# Patient Record
Sex: Female | Born: 1980 | Race: Black or African American | Hispanic: No | Smoking: Never smoker
Health system: Southern US, Community
[De-identification: ages and names within clinical notes are randomized; demographics above are authoritative.]

## PROBLEM LIST (undated history)

## (undated) DIAGNOSIS — R609 Edema, unspecified: Secondary | ICD-10-CM

## (undated) DIAGNOSIS — R519 Headache, unspecified: Secondary | ICD-10-CM

## (undated) DIAGNOSIS — M94262 Chondromalacia, left knee: Secondary | ICD-10-CM

## (undated) DIAGNOSIS — D649 Anemia, unspecified: Secondary | ICD-10-CM

## (undated) DIAGNOSIS — J309 Allergic rhinitis, unspecified: Secondary | ICD-10-CM

## (undated) DIAGNOSIS — M1990 Unspecified osteoarthritis, unspecified site: Secondary | ICD-10-CM

## (undated) DIAGNOSIS — Z973 Presence of spectacles and contact lenses: Secondary | ICD-10-CM

## (undated) DIAGNOSIS — R011 Cardiac murmur, unspecified: Secondary | ICD-10-CM

## (undated) DIAGNOSIS — I10 Essential (primary) hypertension: Secondary | ICD-10-CM

## (undated) DIAGNOSIS — R51 Headache: Secondary | ICD-10-CM

## (undated) DIAGNOSIS — K219 Gastro-esophageal reflux disease without esophagitis: Secondary | ICD-10-CM

## (undated) DIAGNOSIS — S83512A Sprain of anterior cruciate ligament of left knee, initial encounter: Secondary | ICD-10-CM

## (undated) DIAGNOSIS — S83419A Sprain of medial collateral ligament of unspecified knee, initial encounter: Secondary | ICD-10-CM

## (undated) HISTORY — DX: Gastro-esophageal reflux disease without esophagitis: K21.9

## (undated) HISTORY — DX: Unspecified osteoarthritis, unspecified site: M19.90

## (undated) HISTORY — DX: Headache: R51

## (undated) HISTORY — DX: Cardiac murmur, unspecified: R01.1

## (undated) HISTORY — DX: Essential (primary) hypertension: I10

## (undated) HISTORY — DX: Allergic rhinitis, unspecified: J30.9

## (undated) HISTORY — DX: Anemia, unspecified: D64.9

## (undated) HISTORY — DX: Headache, unspecified: R51.9

---
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
## 2002-02-06 ENCOUNTER — Inpatient Hospital Stay (HOSPITAL_COMMUNITY): Admission: AD | Admit: 2002-02-06 | Discharge: 2002-02-06 | Payer: Self-pay | Admitting: *Deleted

## 2002-02-06 ENCOUNTER — Encounter: Payer: Self-pay | Admitting: Family Medicine

## 2002-02-28 ENCOUNTER — Inpatient Hospital Stay (HOSPITAL_COMMUNITY): Admission: AD | Admit: 2002-02-28 | Discharge: 2002-02-28 | Payer: Self-pay | Admitting: *Deleted

## 2002-03-17 ENCOUNTER — Ambulatory Visit (HOSPITAL_COMMUNITY): Admission: RE | Admit: 2002-03-17 | Discharge: 2002-03-17 | Payer: Self-pay | Admitting: *Deleted

## 2002-04-19 ENCOUNTER — Ambulatory Visit (HOSPITAL_COMMUNITY): Admission: RE | Admit: 2002-04-19 | Discharge: 2002-04-19 | Payer: Self-pay | Admitting: *Deleted

## 2002-05-28 ENCOUNTER — Inpatient Hospital Stay (HOSPITAL_COMMUNITY): Admission: AD | Admit: 2002-05-28 | Discharge: 2002-05-28 | Payer: Self-pay | Admitting: Obstetrics and Gynecology

## 2002-06-22 ENCOUNTER — Other Ambulatory Visit: Admission: RE | Admit: 2002-06-22 | Discharge: 2002-06-22 | Payer: Self-pay | Admitting: Obstetrics and Gynecology

## 2002-07-04 ENCOUNTER — Inpatient Hospital Stay (HOSPITAL_COMMUNITY): Admission: AD | Admit: 2002-07-04 | Discharge: 2002-07-04 | Payer: Self-pay | Admitting: Obstetrics & Gynecology

## 2002-07-18 ENCOUNTER — Inpatient Hospital Stay (HOSPITAL_COMMUNITY): Admission: AD | Admit: 2002-07-18 | Discharge: 2002-07-18 | Payer: Self-pay | Admitting: Obstetrics and Gynecology

## 2002-08-13 ENCOUNTER — Inpatient Hospital Stay (HOSPITAL_COMMUNITY): Admission: AD | Admit: 2002-08-13 | Discharge: 2002-08-13 | Payer: Self-pay | Admitting: Obstetrics and Gynecology

## 2002-08-18 ENCOUNTER — Inpatient Hospital Stay (HOSPITAL_COMMUNITY): Admission: AD | Admit: 2002-08-18 | Discharge: 2002-08-18 | Payer: Self-pay | Admitting: Obstetrics and Gynecology

## 2002-08-18 ENCOUNTER — Encounter: Payer: Self-pay | Admitting: Obstetrics and Gynecology

## 2002-09-11 ENCOUNTER — Inpatient Hospital Stay (HOSPITAL_COMMUNITY): Admission: AD | Admit: 2002-09-11 | Discharge: 2002-09-11 | Payer: Self-pay | Admitting: Obstetrics & Gynecology

## 2002-09-12 ENCOUNTER — Inpatient Hospital Stay (HOSPITAL_COMMUNITY): Admission: AD | Admit: 2002-09-12 | Discharge: 2002-09-14 | Payer: Self-pay | Admitting: Obstetrics & Gynecology

## 2002-09-16 ENCOUNTER — Encounter: Payer: Self-pay | Admitting: Emergency Medicine

## 2002-09-16 ENCOUNTER — Emergency Department (HOSPITAL_COMMUNITY): Admission: EM | Admit: 2002-09-16 | Discharge: 2002-09-16 | Payer: Self-pay | Admitting: Emergency Medicine

## 2002-09-29 ENCOUNTER — Encounter: Admission: RE | Admit: 2002-09-29 | Discharge: 2002-09-29 | Payer: Self-pay | Admitting: Family Medicine

## 2002-09-29 ENCOUNTER — Encounter: Payer: Self-pay | Admitting: Family Medicine

## 2003-02-16 ENCOUNTER — Emergency Department (HOSPITAL_COMMUNITY): Admission: AD | Admit: 2003-02-16 | Discharge: 2003-02-17 | Payer: Self-pay | Admitting: Family Medicine

## 2003-02-16 ENCOUNTER — Encounter: Payer: Self-pay | Admitting: Internal Medicine

## 2003-06-11 ENCOUNTER — Emergency Department (HOSPITAL_COMMUNITY): Admission: EM | Admit: 2003-06-11 | Discharge: 2003-06-11 | Payer: Self-pay | Admitting: Emergency Medicine

## 2003-10-28 ENCOUNTER — Emergency Department (HOSPITAL_COMMUNITY): Admission: EM | Admit: 2003-10-28 | Discharge: 2003-10-29 | Payer: Self-pay | Admitting: Emergency Medicine

## 2003-11-01 ENCOUNTER — Emergency Department (HOSPITAL_COMMUNITY): Admission: EM | Admit: 2003-11-01 | Discharge: 2003-11-01 | Payer: Self-pay | Admitting: Family Medicine

## 2003-12-01 ENCOUNTER — Encounter: Admission: RE | Admit: 2003-12-01 | Discharge: 2004-02-29 | Payer: Self-pay | Admitting: Chiropractic Medicine

## 2003-12-06 ENCOUNTER — Encounter: Admission: RE | Admit: 2003-12-06 | Discharge: 2004-03-05 | Payer: Self-pay | Admitting: Orthopedic Surgery

## 2004-02-07 ENCOUNTER — Ambulatory Visit: Payer: Self-pay | Admitting: *Deleted

## 2004-06-26 ENCOUNTER — Encounter: Admission: RE | Admit: 2004-06-26 | Discharge: 2004-06-26 | Payer: Self-pay | Admitting: Obstetrics and Gynecology

## 2004-08-09 ENCOUNTER — Emergency Department (HOSPITAL_COMMUNITY): Admission: EM | Admit: 2004-08-09 | Discharge: 2004-08-09 | Payer: Self-pay | Admitting: Emergency Medicine

## 2004-09-09 ENCOUNTER — Emergency Department (HOSPITAL_COMMUNITY): Admission: EM | Admit: 2004-09-09 | Discharge: 2004-09-09 | Payer: Self-pay | Admitting: Emergency Medicine

## 2004-11-22 ENCOUNTER — Emergency Department (HOSPITAL_COMMUNITY): Admission: EM | Admit: 2004-11-22 | Discharge: 2004-11-22 | Payer: Self-pay | Admitting: Emergency Medicine

## 2004-11-22 ENCOUNTER — Emergency Department (HOSPITAL_COMMUNITY): Admission: EM | Admit: 2004-11-22 | Discharge: 2004-11-22 | Payer: Self-pay | Admitting: Family Medicine

## 2005-05-30 ENCOUNTER — Emergency Department (HOSPITAL_COMMUNITY): Admission: EM | Admit: 2005-05-30 | Discharge: 2005-05-30 | Payer: Self-pay | Admitting: Family Medicine

## 2005-08-03 IMAGING — CR DG CHEST 2V
2 series · 2 of 2 positions shown · non-contrast
Comparison: none

CLINICAL DATA: Chest pain.  Back pain.  Shortness of breath. 
 CHEST ? 2 VIEW:
 PA and lateral views of the chest dated 09/09/04 are compared with a prior study of 08/09/04.  The heart remains normal in size.  The pulmonary vascularity and the lung fields are unremarkable.

[view not recorded (1 of 2)]
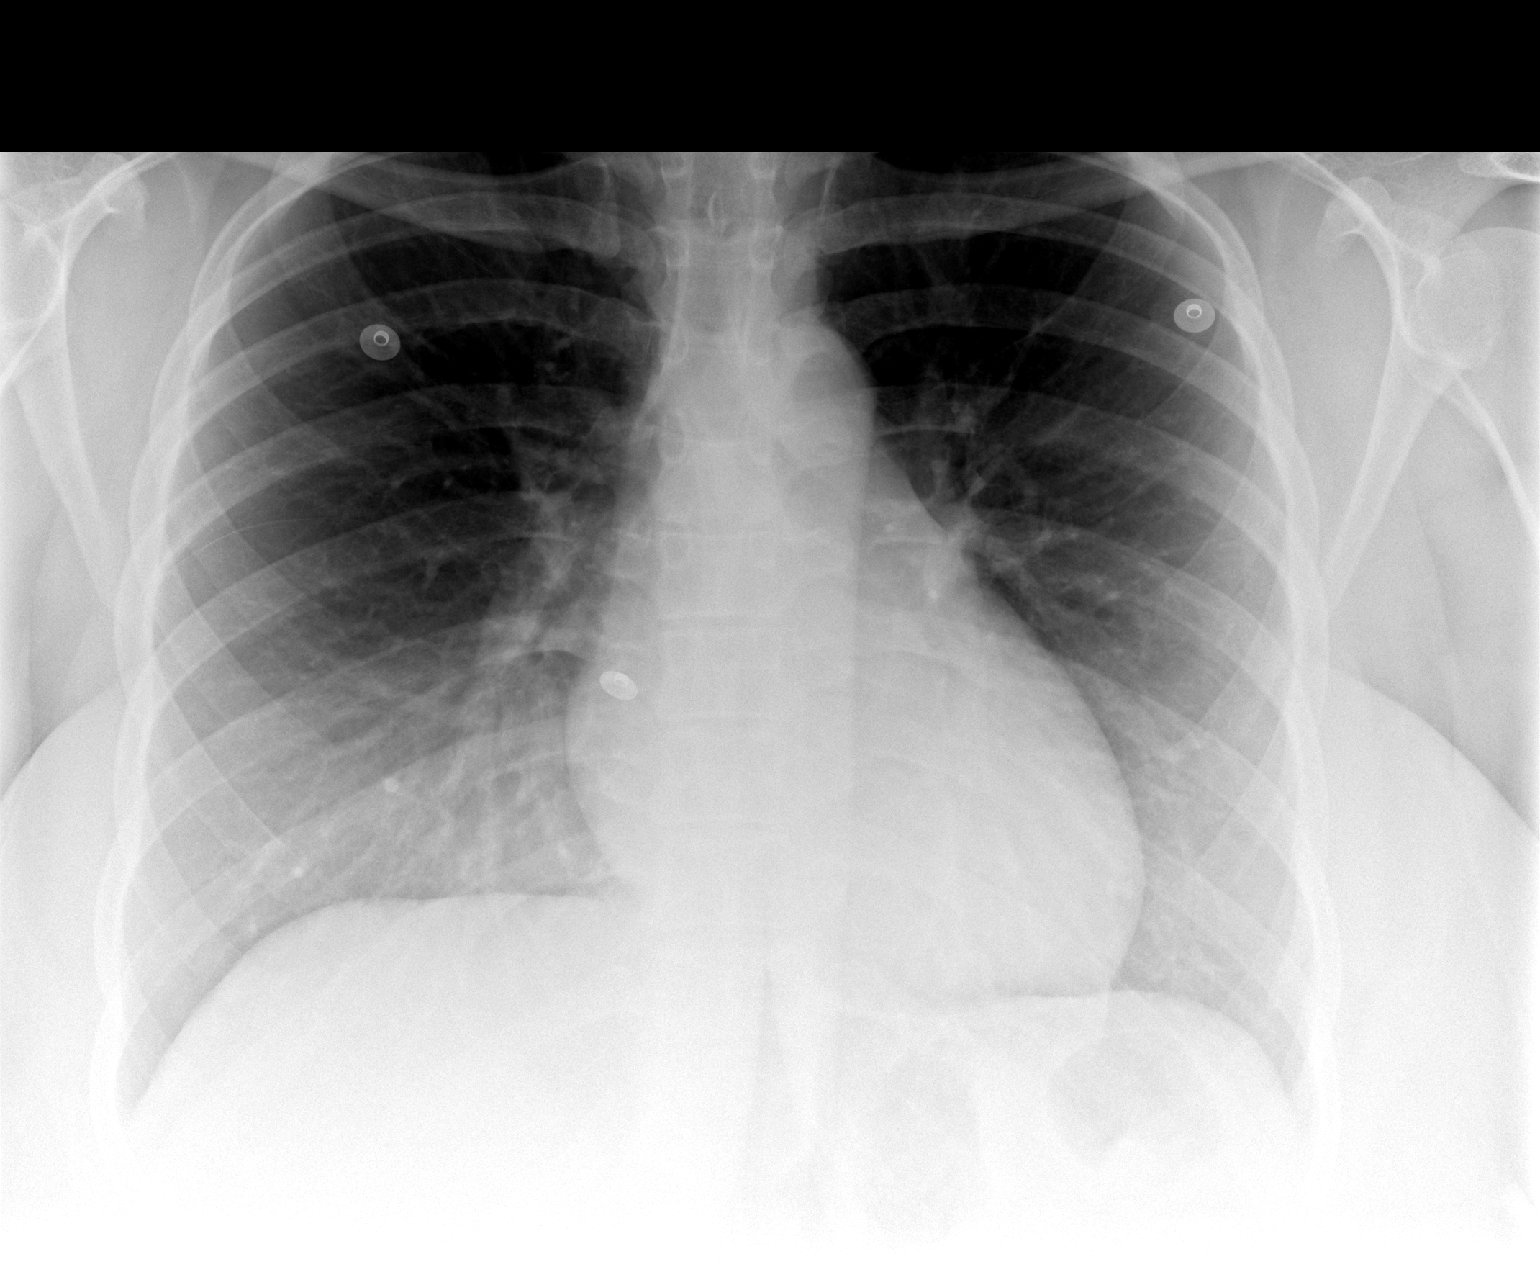

[view not recorded (2 of 2)]
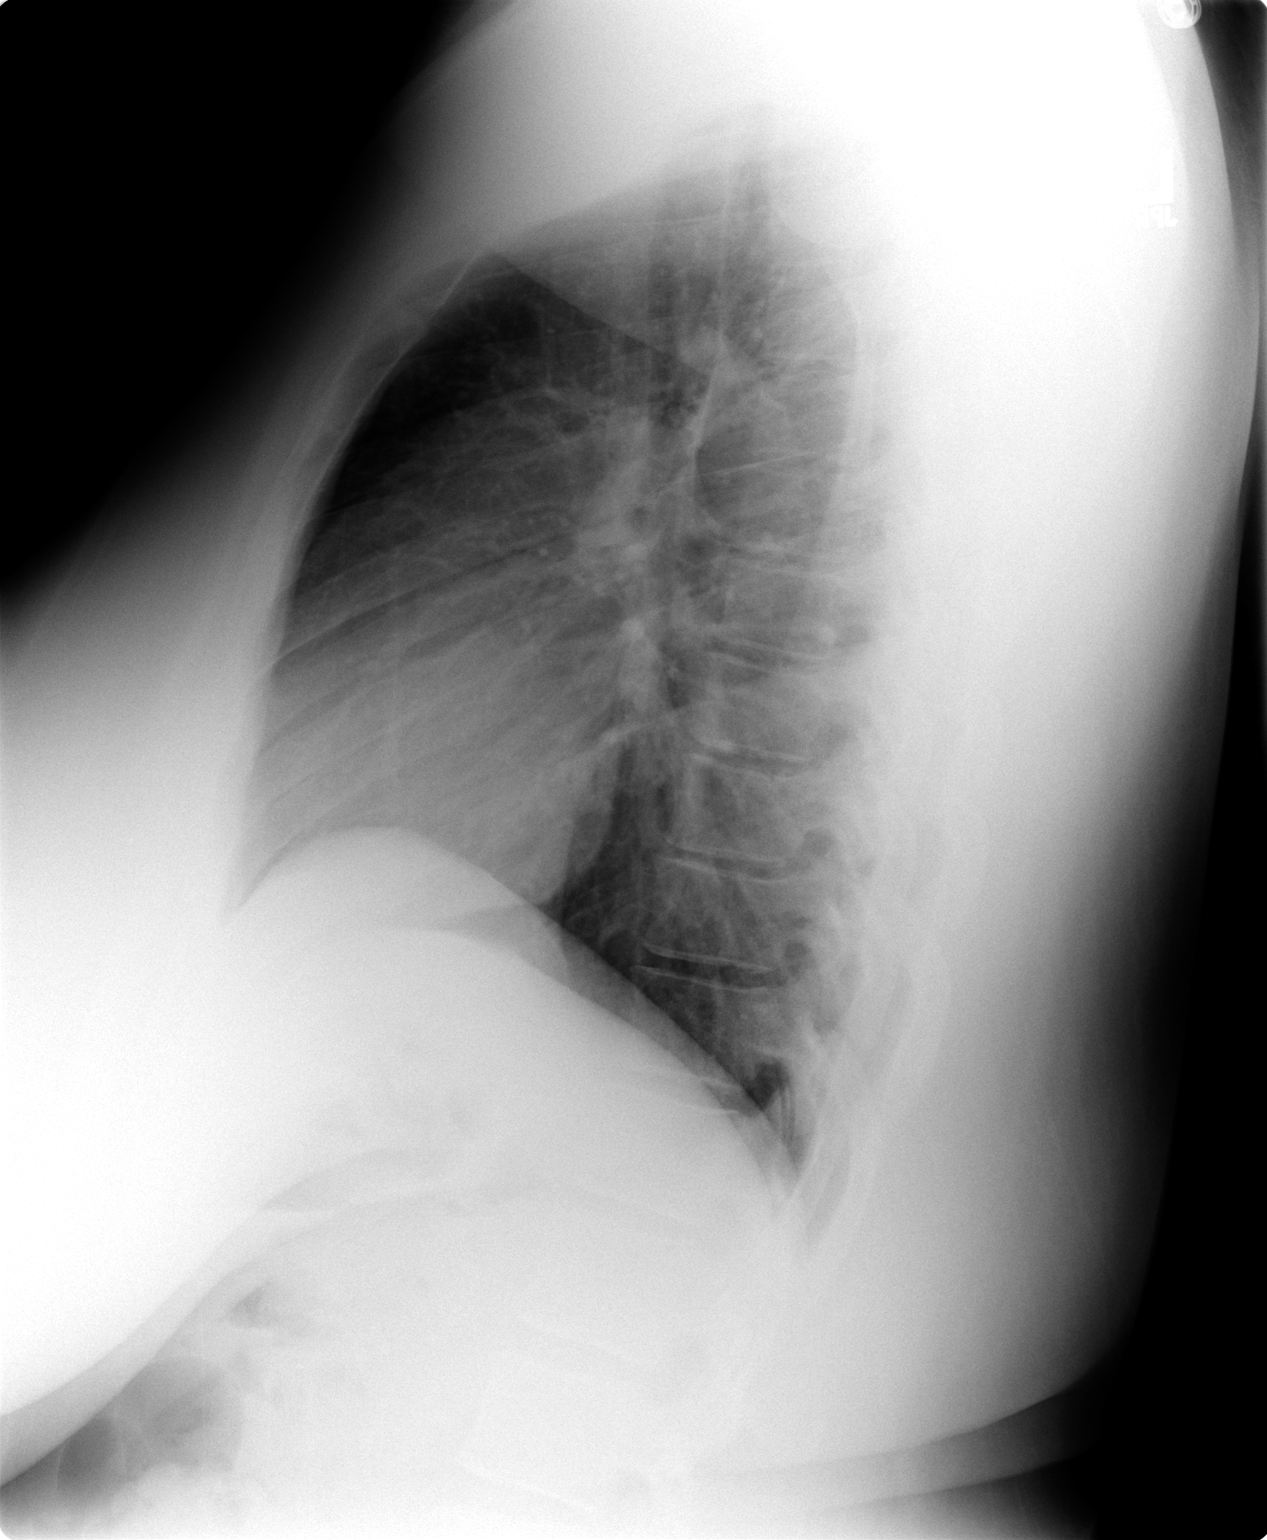

[2 of 2 positions shown; findings below may reference images not displayed]

IMPRESSION: Negative chest for active disease or interval change.

## 2006-03-24 ENCOUNTER — Emergency Department (HOSPITAL_COMMUNITY): Admission: EM | Admit: 2006-03-24 | Discharge: 2006-03-24 | Payer: Self-pay | Admitting: Family Medicine

## 2006-11-24 ENCOUNTER — Emergency Department (HOSPITAL_COMMUNITY): Admission: EM | Admit: 2006-11-24 | Discharge: 2006-11-24 | Payer: Self-pay | Admitting: Emergency Medicine

## 2006-11-26 ENCOUNTER — Emergency Department (HOSPITAL_COMMUNITY): Admission: EM | Admit: 2006-11-26 | Discharge: 2006-11-26 | Payer: Self-pay | Admitting: Emergency Medicine

## 2007-01-07 ENCOUNTER — Ambulatory Visit: Payer: Self-pay | Admitting: Obstetrics and Gynecology

## 2007-03-17 ENCOUNTER — Emergency Department (HOSPITAL_COMMUNITY): Admission: EM | Admit: 2007-03-17 | Discharge: 2007-03-17 | Payer: Self-pay | Admitting: Emergency Medicine

## 2007-03-25 ENCOUNTER — Emergency Department (HOSPITAL_COMMUNITY): Admission: EM | Admit: 2007-03-25 | Discharge: 2007-03-25 | Payer: Self-pay | Admitting: Emergency Medicine

## 2007-03-27 ENCOUNTER — Emergency Department (HOSPITAL_COMMUNITY): Admission: EM | Admit: 2007-03-27 | Discharge: 2007-03-27 | Payer: Self-pay | Admitting: Emergency Medicine

## 2007-05-12 ENCOUNTER — Emergency Department (HOSPITAL_COMMUNITY): Admission: EM | Admit: 2007-05-12 | Discharge: 2007-05-12 | Payer: Self-pay | Admitting: Emergency Medicine

## 2007-06-08 ENCOUNTER — Emergency Department (HOSPITAL_COMMUNITY): Admission: EM | Admit: 2007-06-08 | Discharge: 2007-06-08 | Payer: Self-pay | Admitting: Emergency Medicine

## 2007-06-09 ENCOUNTER — Emergency Department (HOSPITAL_COMMUNITY): Admission: EM | Admit: 2007-06-09 | Discharge: 2007-06-09 | Payer: Self-pay | Admitting: Family Medicine

## 2008-01-20 ENCOUNTER — Ambulatory Visit: Payer: Self-pay | Admitting: Family Medicine

## 2008-01-25 ENCOUNTER — Emergency Department (HOSPITAL_COMMUNITY): Admission: EM | Admit: 2008-01-25 | Discharge: 2008-01-25 | Payer: Self-pay | Admitting: Family Medicine

## 2008-02-05 ENCOUNTER — Emergency Department (HOSPITAL_COMMUNITY): Admission: EM | Admit: 2008-02-05 | Discharge: 2008-02-05 | Payer: Self-pay | Admitting: Emergency Medicine

## 2008-02-10 ENCOUNTER — Encounter: Payer: Self-pay | Admitting: Physician Assistant

## 2008-02-10 ENCOUNTER — Ambulatory Visit: Payer: Self-pay | Admitting: Obstetrics & Gynecology

## 2008-02-10 LAB — CONVERTED CEMR LAB

## 2008-02-11 ENCOUNTER — Emergency Department (HOSPITAL_COMMUNITY): Admission: EM | Admit: 2008-02-11 | Discharge: 2008-02-11 | Payer: Self-pay | Admitting: Emergency Medicine

## 2008-02-11 ENCOUNTER — Encounter: Payer: Self-pay | Admitting: Physician Assistant

## 2008-02-11 LAB — CONVERTED CEMR LAB: GC Probe Amp, Genital: NEGATIVE

## 2008-02-22 ENCOUNTER — Ambulatory Visit: Payer: Self-pay | Admitting: Family Medicine

## 2008-03-06 ENCOUNTER — Emergency Department (HOSPITAL_COMMUNITY): Admission: EM | Admit: 2008-03-06 | Discharge: 2008-03-06 | Payer: Self-pay | Admitting: Emergency Medicine

## 2008-05-05 ENCOUNTER — Inpatient Hospital Stay (HOSPITAL_COMMUNITY): Admission: AD | Admit: 2008-05-05 | Discharge: 2008-05-06 | Payer: Self-pay | Admitting: Obstetrics and Gynecology

## 2008-05-12 ENCOUNTER — Emergency Department (HOSPITAL_COMMUNITY): Admission: EM | Admit: 2008-05-12 | Discharge: 2008-05-12 | Payer: Self-pay | Admitting: Emergency Medicine

## 2008-05-17 ENCOUNTER — Emergency Department (HOSPITAL_COMMUNITY): Admission: EM | Admit: 2008-05-17 | Discharge: 2008-05-17 | Payer: Self-pay | Admitting: Emergency Medicine

## 2008-09-06 ENCOUNTER — Inpatient Hospital Stay (HOSPITAL_COMMUNITY): Admission: AD | Admit: 2008-09-06 | Discharge: 2008-09-07 | Payer: Self-pay | Admitting: Family Medicine

## 2008-09-21 ENCOUNTER — Emergency Department (HOSPITAL_COMMUNITY): Admission: EM | Admit: 2008-09-21 | Discharge: 2008-09-21 | Payer: Self-pay | Admitting: Emergency Medicine

## 2009-03-01 ENCOUNTER — Emergency Department (HOSPITAL_COMMUNITY): Admission: EM | Admit: 2009-03-01 | Discharge: 2009-03-01 | Payer: Self-pay | Admitting: Family Medicine

## 2009-03-10 ENCOUNTER — Emergency Department (HOSPITAL_COMMUNITY): Admission: EM | Admit: 2009-03-10 | Discharge: 2009-03-10 | Payer: Self-pay | Admitting: Emergency Medicine

## 2009-03-10 ENCOUNTER — Emergency Department (HOSPITAL_COMMUNITY): Admission: EM | Admit: 2009-03-10 | Discharge: 2009-03-10 | Payer: Self-pay | Admitting: Family Medicine

## 2009-04-08 ENCOUNTER — Inpatient Hospital Stay (HOSPITAL_COMMUNITY): Admission: AD | Admit: 2009-04-08 | Discharge: 2009-04-08 | Payer: Self-pay | Admitting: Obstetrics & Gynecology

## 2009-04-23 ENCOUNTER — Emergency Department (HOSPITAL_COMMUNITY): Admission: EM | Admit: 2009-04-23 | Discharge: 2009-04-23 | Payer: Self-pay | Admitting: Emergency Medicine

## 2009-06-06 ENCOUNTER — Inpatient Hospital Stay (HOSPITAL_COMMUNITY): Admission: AD | Admit: 2009-06-06 | Discharge: 2009-06-06 | Payer: Self-pay | Admitting: Obstetrics & Gynecology

## 2009-07-08 ENCOUNTER — Inpatient Hospital Stay (HOSPITAL_COMMUNITY): Admission: AD | Admit: 2009-07-08 | Discharge: 2009-07-08 | Payer: Self-pay | Admitting: Obstetrics and Gynecology

## 2009-07-15 ENCOUNTER — Inpatient Hospital Stay (HOSPITAL_COMMUNITY): Admission: AD | Admit: 2009-07-15 | Discharge: 2009-07-15 | Payer: Self-pay | Admitting: Obstetrics and Gynecology

## 2009-07-15 ENCOUNTER — Ambulatory Visit: Payer: Self-pay | Admitting: Obstetrics and Gynecology

## 2009-10-25 ENCOUNTER — Ambulatory Visit: Payer: Self-pay | Admitting: Nurse Practitioner

## 2009-10-25 ENCOUNTER — Inpatient Hospital Stay (HOSPITAL_COMMUNITY): Admission: AD | Admit: 2009-10-25 | Discharge: 2009-10-25 | Payer: Self-pay | Admitting: Obstetrics and Gynecology

## 2009-12-13 ENCOUNTER — Inpatient Hospital Stay (HOSPITAL_COMMUNITY): Admission: AD | Admit: 2009-12-13 | Discharge: 2009-12-13 | Payer: Self-pay | Admitting: Obstetrics and Gynecology

## 2009-12-13 ENCOUNTER — Ambulatory Visit: Payer: Self-pay | Admitting: Family Medicine

## 2010-01-25 ENCOUNTER — Inpatient Hospital Stay (HOSPITAL_COMMUNITY): Admission: RE | Admit: 2010-01-25 | Discharge: 2010-01-28 | Payer: Self-pay | Admitting: Obstetrics and Gynecology

## 2010-01-25 ENCOUNTER — Encounter (INDEPENDENT_AMBULATORY_CARE_PROVIDER_SITE_OTHER): Payer: Self-pay | Admitting: Obstetrics and Gynecology

## 2010-02-16 ENCOUNTER — Encounter
Admission: RE | Admit: 2010-02-16 | Discharge: 2010-03-18 | Payer: Self-pay | Source: Home / Self Care | Admitting: Obstetrics and Gynecology

## 2010-03-19 ENCOUNTER — Encounter
Admission: RE | Admit: 2010-03-19 | Discharge: 2010-04-18 | Payer: Self-pay | Source: Home / Self Care | Attending: Obstetrics and Gynecology | Admitting: Obstetrics and Gynecology

## 2010-04-19 ENCOUNTER — Encounter
Admission: RE | Admit: 2010-04-19 | Discharge: 2010-05-19 | Payer: Self-pay | Source: Home / Self Care | Attending: Obstetrics and Gynecology | Admitting: Obstetrics and Gynecology

## 2010-05-24 ENCOUNTER — Emergency Department (HOSPITAL_COMMUNITY)
Admission: EM | Admit: 2010-05-24 | Discharge: 2010-05-24 | Payer: Self-pay | Source: Home / Self Care | Admitting: Emergency Medicine

## 2010-07-11 LAB — CBC
HCT: 36.1 % (ref 36.0–46.0)
Hemoglobin: 10.8 g/dL — ABNORMAL LOW (ref 12.0–15.0)
MCHC: 33.8 g/dL (ref 30.0–36.0)
Platelets: 267 10*3/uL (ref 150–400)
RBC: 3.63 MIL/uL — ABNORMAL LOW (ref 3.87–5.11)
RDW: 13.5 % (ref 11.5–15.5)
WBC: 14.2 10*3/uL — ABNORMAL HIGH (ref 4.0–10.5)
WBC: 10.9 10*3/uL — ABNORMAL HIGH (ref 4.0–10.5)

## 2010-07-11 LAB — SURGICAL PCR SCREEN
MRSA, PCR: NEGATIVE
Staphylococcus aureus: NEGATIVE

## 2010-07-11 LAB — BASIC METABOLIC PANEL
BUN: 5 mg/dL — ABNORMAL LOW (ref 6–23)
GFR calc non Af Amer: >60 mL/min (ref >60)
Potassium: 3.7 mEq/L (ref 3.5–5.1)

## 2010-07-12 LAB — URINALYSIS, ROUTINE W REFLEX MICROSCOPIC
Glucose, UA: NEGATIVE mg/dL
Hgb urine dipstick: NEGATIVE
Leukocytes, UA: NEGATIVE
Protein, ur: 30 mg/dL — AB
Specific Gravity, Urine: >1.03 — ABNORMAL HIGH (ref 1.005–1.030)
Urobilinogen, UA: 1 mg/dL (ref 0.0–1.0)

## 2010-07-12 LAB — WET PREP, GENITAL: Yeast Wet Prep HPF POC: NONE SEEN

## 2010-07-12 LAB — URINE MICROSCOPIC-ADD ON

## 2010-07-13 ENCOUNTER — Emergency Department (HOSPITAL_COMMUNITY): Payer: Self-pay

## 2010-07-13 ENCOUNTER — Emergency Department (HOSPITAL_COMMUNITY)
Admission: EM | Admit: 2010-07-13 | Discharge: 2010-07-13 | Disposition: A | Discharge status: Home / Self Care | Payer: Self-pay | Attending: Emergency Medicine | Admitting: Emergency Medicine

## 2010-07-13 DIAGNOSIS — M542 Cervicalgia: Secondary | ICD-10-CM | POA: Insufficient documentation

## 2010-07-13 DIAGNOSIS — H5710 Ocular pain, unspecified eye: Secondary | ICD-10-CM | POA: Insufficient documentation

## 2010-07-13 DIAGNOSIS — R51 Headache: Secondary | ICD-10-CM | POA: Insufficient documentation

## 2010-07-15 LAB — URINALYSIS, ROUTINE W REFLEX MICROSCOPIC
Glucose, UA: NEGATIVE mg/dL
Ketones, ur: 15 mg/dL — AB
Nitrite: NEGATIVE
Specific Gravity, Urine: >1.03 — ABNORMAL HIGH (ref 1.005–1.030)
pH: 6 (ref 5.0–8.0)

## 2010-07-15 LAB — CBC
HCT: 34.8 % — ABNORMAL LOW (ref 36.0–46.0)
Hemoglobin: 11.9 g/dL — ABNORMAL LOW (ref 12.0–15.0)
MCV: 87.4 fL (ref 78.0–100.0)
Platelets: 261 10*3/uL (ref 150–400)

## 2010-07-15 LAB — WET PREP, GENITAL: Trich, Wet Prep: NONE SEEN

## 2010-07-15 LAB — DIFFERENTIAL
Lymphocytes Relative: 22 % (ref 12–46)
Lymphs Abs: 3 10*3/uL (ref 0.7–4.0)
Neutrophils Relative %: 75 % (ref 43–77)

## 2010-07-19 LAB — URINALYSIS, ROUTINE W REFLEX MICROSCOPIC
Ketones, ur: NEGATIVE mg/dL
Nitrite: NEGATIVE
Specific Gravity, Urine: >1.03 — ABNORMAL HIGH (ref 1.005–1.030)
Urobilinogen, UA: 0.2 mg/dL (ref 0.0–1.0)
pH: 5.5 (ref 5.0–8.0)

## 2010-07-19 LAB — WET PREP, GENITAL: Trich, Wet Prep: NONE SEEN

## 2010-07-19 LAB — HCG, QUANTITATIVE, PREGNANCY: hCG, Beta Chain, Quant, S: 27300 m[IU]/mL — ABNORMAL HIGH (ref <5)

## 2010-07-19 LAB — CBC
Hemoglobin: 12.2 g/dL (ref 12.0–15.0)
RBC: 4.25 MIL/uL (ref 3.87–5.11)
RDW: 12.9 % (ref 11.5–15.5)

## 2010-07-19 LAB — POCT PREGNANCY, URINE: Preg Test, Ur: POSITIVE

## 2010-07-19 LAB — ABO/RH: ABO/RH(D): A POS

## 2010-07-19 LAB — GC/CHLAMYDIA PROBE AMP, GENITAL: GC Probe Amp, Genital: NEGATIVE

## 2010-07-22 LAB — URINALYSIS, ROUTINE W REFLEX MICROSCOPIC
Bilirubin Urine: NEGATIVE
Glucose, UA: NEGATIVE mg/dL
Glucose, UA: NEGATIVE mg/dL
Hgb urine dipstick: NEGATIVE
Ketones, ur: 15 mg/dL — AB
Protein, ur: 30 mg/dL — AB
Protein, ur: NEGATIVE mg/dL
Urobilinogen, UA: 0.2 mg/dL (ref 0.0–1.0)
pH: 7.5 (ref 5.0–8.0)

## 2010-07-22 LAB — WET PREP, GENITAL
Trich, Wet Prep: NONE SEEN
Yeast Wet Prep HPF POC: NONE SEEN

## 2010-07-22 LAB — URINE MICROSCOPIC-ADD ON

## 2010-07-22 LAB — GC/CHLAMYDIA PROBE AMP, GENITAL: GC Probe Amp, Genital: NEGATIVE

## 2010-07-29 LAB — POCT URINALYSIS DIP (DEVICE)
Bilirubin Urine: NEGATIVE
Glucose, UA: NEGATIVE mg/dL
Hgb urine dipstick: NEGATIVE
Specific Gravity, Urine: >=1.03 (ref 1.005–1.030)
Urobilinogen, UA: 0.2 mg/dL (ref 0.0–1.0)

## 2010-07-29 LAB — POCT PREGNANCY, URINE: Preg Test, Ur: NEGATIVE

## 2010-07-29 LAB — GC/CHLAMYDIA PROBE AMP, GENITAL
Chlamydia, DNA Probe: NEGATIVE
GC Probe Amp, Genital: NEGATIVE

## 2010-07-29 LAB — WET PREP, GENITAL: Yeast Wet Prep HPF POC: NONE SEEN

## 2010-07-30 LAB — POCT PREGNANCY, URINE: Preg Test, Ur: NEGATIVE

## 2010-07-31 LAB — DIFFERENTIAL
Basophils Absolute: 0 10*3/uL (ref 0.0–0.1)
Eosinophils Absolute: 0.1 10*3/uL (ref 0.0–0.7)
Eosinophils Absolute: 0.2 10*3/uL (ref 0.0–0.7)
Eosinophils Relative: 2 % (ref 0–5)
Eosinophils Relative: 2 % (ref 0–5)
Lymphs Abs: 2.2 10*3/uL (ref 0.7–4.0)
Lymphs Abs: 3.4 10*3/uL (ref 0.7–4.0)
Monocytes Absolute: 0.5 10*3/uL (ref 0.1–1.0)
Monocytes Relative: 10 % (ref 3–12)

## 2010-07-31 LAB — CBC
HCT: 37.2 % (ref 36.0–46.0)
HCT: 37.8 % (ref 36.0–46.0)
MCV: 86.9 fL (ref 78.0–100.0)
MCV: 87.5 fL (ref 78.0–100.0)
Platelets: 274 10*3/uL (ref 150–400)
RBC: 4.32 MIL/uL (ref 3.87–5.11)
RDW: 12.5 % (ref 11.5–15.5)
WBC: 5.3 10*3/uL (ref 4.0–10.5)

## 2010-07-31 LAB — POCT URINALYSIS DIP (DEVICE)
Glucose, UA: NEGATIVE mg/dL
Specific Gravity, Urine: >=1.03 (ref 1.005–1.030)

## 2010-08-06 LAB — DIFFERENTIAL
Basophils Absolute: 0.1 10*3/uL (ref 0.0–0.1)
Basophils Relative: 1 % (ref 0–1)
Eosinophils Absolute: 0.1 10*3/uL (ref 0.0–0.7)
Eosinophils Relative: 1 % (ref 0–5)
Lymphocytes Relative: 42 % (ref 12–46)

## 2010-08-06 LAB — URINALYSIS, ROUTINE W REFLEX MICROSCOPIC
Glucose, UA: NEGATIVE mg/dL
Ketones, ur: NEGATIVE mg/dL
Protein, ur: NEGATIVE mg/dL

## 2010-08-06 LAB — WET PREP, GENITAL: Yeast Wet Prep HPF POC: NONE SEEN

## 2010-08-06 LAB — CBC
HCT: 35.7 % — ABNORMAL LOW (ref 36.0–46.0)
MCHC: 34.1 g/dL (ref 30.0–36.0)
MCV: 87.2 fL (ref 78.0–100.0)
Platelets: 263 10*3/uL (ref 150–400)
RDW: 12.9 % (ref 11.5–15.5)

## 2010-08-12 LAB — POCT I-STAT, CHEM 8
BUN: 10 mg/dL (ref 6–23)
Chloride: 106 mEq/L (ref 96–112)
Creatinine, Ser: 0.8 mg/dL (ref 0.4–1.2)
Glucose, Bld: 99 mg/dL (ref 70–99)
Potassium: 3.7 mEq/L (ref 3.5–5.1)

## 2010-08-12 LAB — URINE MICROSCOPIC-ADD ON

## 2010-08-12 LAB — URINALYSIS, ROUTINE W REFLEX MICROSCOPIC
Glucose, UA: NEGATIVE mg/dL
Leukocytes, UA: NEGATIVE
Protein, ur: 30 mg/dL — AB
pH: 5.5 (ref 5.0–8.0)

## 2010-08-12 LAB — CBC
HCT: 38.6 % (ref 36.0–46.0)
Hemoglobin: 12.6 g/dL (ref 12.0–15.0)
MCHC: 32.7 g/dL (ref 30.0–36.0)
RBC: 4.49 MIL/uL (ref 3.87–5.11)
RDW: 13.2 % (ref 11.5–15.5)

## 2010-08-12 LAB — GC/CHLAMYDIA PROBE AMP, GENITAL
Chlamydia, DNA Probe: NEGATIVE
GC Probe Amp, Genital: NEGATIVE

## 2010-08-12 LAB — COMPREHENSIVE METABOLIC PANEL
Alkaline Phosphatase: 83 U/L (ref 39–117)
BUN: 12 mg/dL (ref 6–23)
CO2: 27 mEq/L (ref 19–32)
Calcium: 9.2 mg/dL (ref 8.4–10.5)
GFR calc non Af Amer: >60 mL/min (ref >60)
Glucose, Bld: 94 mg/dL (ref 70–99)
Total Protein: 6.7 g/dL (ref 6.0–8.3)

## 2010-08-12 LAB — WET PREP, GENITAL
Clue Cells Wet Prep HPF POC: NONE SEEN
Trich, Wet Prep: NONE SEEN

## 2010-09-10 NOTE — Group Therapy Note (Signed)
NAMESHARNAE, WINFREE NO.:  [id number]   MEDICAL RECORD NO.:  [id number]          PATIENT TYPE:  WOC   LOCATION:  [facility]                   FACILITY:  [facility]   PHYSICIAN:  Argentina Donovan, MD        DATE OF BIRTH:  04-21-1981   DATE OF SERVICE:                                  CLINIC NOTE   SUBJECTIVE:  The patient is a 30 year old African-American female  gravida 1, para 1-0-0-1 who was referred by [facility]  because during a routine gynecological examination for family planning  they noticed a cervical polyp and sent her in for polyp removal.   PHYSICAL EXAMINATION:  Examining the patient, the external genitalia is  normal.  BUS within normal limits.  Vagina clean and well rugated.  The  cervix is clean, parous with a 1-1/2-cm polyp noted at the external os  of the cervix which was removed quite easily with a ring forceps, and  the base of coagulated with silver nitrate.  The patient is using  Implanon for contraception and is allergic to penicillin.   DIAGNOSIS:  Cervical polyp removed.           ______________________________  Argentina Donovan, MD     PR/MEDQ  D:  01/07/2007  T:  01/08/2007  Job:  [id number]

## 2010-09-10 NOTE — Group Therapy Note (Signed)
NAMESHEMICA, MEATH NO.:  [id number]   MEDICAL RECORD NO.:  [id number]          PATIENT TYPE:  WOC   LOCATION:  [facility]                   FACILITY:  [facility]   PHYSICIAN:  Maylon Cos, CNM    DATE OF BIRTH:  1980/07/06   DATE OF SERVICE:  02/10/2008                                  CLINIC NOTE   HISTORY OF PRESENT ILLNESS:  She presents today for a physical  examination and removal of her implanon.  The patient a 30 year old  African American female who is morbidly obese.  She had her implanon  placed in May 2008, and had complaints of her left arm going numb, and  feels like it is related to her implanon, and wants it removed related  to this.  She has been very happy with her birth control.  She has not  had menstrual cycles since it was placed.  She has had implanon chosen  due to unsuccessful use and not being a candidate for multiple other  contraceptive methods.  Other complaints today:  She is currently being  treated for a longstanding pharyngitis.  She states that she has been  treated now for approximately one month for pharyngitis where she has  been running a low grade fever of approximately 99.  They changed  antibiotics two times.  She was started on azithromycin and is now  currently on doxycycline.  Rapid strep and strep cultures have both come  back negative.  This treatment has happened through both [facility] and  [facility].  At today's visit, she does desire Pap smear and SCI  screening as well.   ALLERGIES:  IN HER MEDICAL HISTORY, SHE IS ALLERGIC TO PENICILLIN.   CURRENT MEDICATION:  1. Over-the-counter allergy medications.  2. A prescription nasal spray, however, she is aware of the name of      this.  3. Doxycycline, which she will end taking on Saturday.   IMMUNIZATIONS:  Currently up-to-date.   MENSTRUAL HISTORY:  She is not currently having a menstrual cycle since  May 2008 when her implanon was placed.   OBSTETRICAL  HISTORY:  She has been pregnant one time.  She has one  living child that was born 5 years ago via vaginal delivery.  There were  no complications.   PAST GYNECOLOGICAL HISTORY:  Her last Pap smear was done at in May 2008  which was normal.  She does have a history of abnormal Pap smears.  However, has never had any colpo cryo relief.   PAST SURGICAL HISTORY:  She has no known surgical history.   FAMILY HISTORY:  Positive for diabetes, heart disease in her  grandmother.  Heart attack in her grandmother.  High blood pressure in  both her parents, and a history of blood clots in her mother.   PERSONAL MEDICAL HISTORY:  Positive only for high blood pressure, is  currently being controlled on no medications.  She is currently working  on diet and exercise to control these.   SOCIAL HISTORY:  She is divorced, lives with her daughter.  She works as  a Advertising account planner the elderly,  and is in school at [employer] for  nursing.  She is a nonsmoker.  She does not drink any alcohol.  She  occasionally drinks caffeinated beverages, and she denies any IV drug or  addicting drug use as well as physical, mental or sexual abuse.   SYSTEMIC REVIEW:  Negative other than what was reviewed in the HPI.   PHYSICAL EXAMINATION:  GENERAL:  A very pleasant 30 year old [place]  American female who appears her stated age.  She is morbidly obese.  VITAL SIGNS:  Stable, temperature is 99.1, pulse is 77.  Her blood  pressure is 139/86, weight today is 307.6 and her height is 6 feet 1  inch.  HEENT:  She has good dentition.  Her pharynx is erythematous with no  exudate, +2 tonsils and pustular lesions.  She has no lymphadenopathy.  HEART:  Regular rate and rhythm without murmur.  No bruits.  LUNGS:  Clear to auscultation bilateral A&P.  BREASTS:  Large and pendulous.  They are symmetrical without masses or  lesions.  There is no dimpling or retracting of the skin.  Nipples are  erect without discharge.   ABDOMEN:  Obese with irregular stria.  It is nontender.  There are no  masses.  Femoral pulses are equal and bounding bilaterally.  GENITALIA:  She is a Tanner V.  Mucous membranes pink with a scant amount of white  discharge.  She has good tone and irregular rugae.  Cervix is easily  visualized and appears to pink without lesions.  On bimanual exam, there  is no cervical motion tenderness.  The cervix is nonfriable.  Bimanual  exam is grossly normal, and she has no non tenderness.  However, it is  limited by her pannus  EXTREMITIES:  Warm to touch and have even hair distribution.  Patient's  implanon is in her left chest arm, superficial and nontender.  The  patient does have some nerve involvement with deep palpation that does  not appear to involve the IUD in any way.   ASSESSMENT/PLAN:  1. Kathreen is a well woman on plan today.  Pap smear was performed.  2. SDI screening, throat culture was done for gonorrhea and chlamydia      secondary to longstanding history of pharyngitis, not relieved by      oral antibiotics.  Discussed with the patient need for IM Rocephin      for pharyngeal gonorrhea if culture comes back positive.  The      patient is having blood work for treatment today.  Contraceptive      surveillance.  The patient has opted after discussion to not have      her IUD removed.           ______________________________  Maylon Cos, CNM     SS/MEDQ  D:  02/10/2008  T:  02/10/2008  Job:  [id number]

## 2010-09-13 NOTE — H&P (Signed)
   Kaitlyn Raymond, BRADNER NO.:  [id number]   MEDICAL RECORD NO.:  [id number]                   PATIENT TYPE:  AMB   LOCATION:  [facility]                                  [facility]   PHYSICIAN:  [place] B. Earlene Plater, M.D.               DATE OF BIRTH:  01/04/1981   DATE OF ADMISSION:  12/08/2001  DATE OF DISCHARGE:                                HISTORY & PHYSICAL   PREOPERATIVE DIAGNOSES:  1. Right ovarian cyst.  2. Right lower quadrant pain.   HISTORY OF PRESENT ILLNESS:  A 30 year old African-American female, who has  had two months of intermittent right lower quadrant pain, has been seen in  the emergency department on two occasions and has been followed in the  office with serial ultrasounds for a right ovarian cyst.  Cyst has decreased  in size from initially about 6 cm now to the 5 cm range; however, it is  complex in appearance and is persistent.  Additionally, the patient is  having continued problems with pain, multiple visits to the emergency  department, and needing narcotic pain medication.  Therefore, she presents  for definitive surgical treatment of her ovarian cyst.   PAST MEDICAL HISTORY:  None.   PAST SURGICAL HISTORY:  None.   MEDICATIONS:  Ortho Tri-Cyclen.   ALLERGIES:  PENICILLIN.   SOCIAL HISTORY:  No alcohol or tobacco or drugs.   FAMILY HISTORY:  Diabetes, hypertension.   REVIEW OF SYSTEMS:  Otherwise negative.   PHYSICAL EXAMINATION:  VITAL SIGNS:  Blood pressure 118/78, temp 98, weight 283.  GENERAL:  Alert and oriented, in no acute distress.  SKIN:  Warm and dry, no lesions.  ABDOMEN:  Liver, spleen, no evidence of hernia.  HEART:  Regular rate and rhythm.  LUNGS:  Clear to auscultation.  PELVIC:  Normal external genitalia.  Vagina, cervix normal.  No cervical  motion tenderness.  Right adnexal tenderness but no mass palpable, limited  due to patient's obesity.    ASSESSMENT:  Persistent right ovarian cyst with  chronic right lower quadrant  pain, desires surgical treatment.  Details of procedure discussed as  operative laparoscopy, right ovarian cystectomy.  Operative risks discussed  including infection, bleeding, damage to bowel, bladder, adjacent organs.  All questions answered.  The patient wishes to proceed.                                                Gerri Spore B. Earlene Plater, M.D.    WBD/MEDQ  D:  12/03/2001  T:  12/03/2001  Job:  [id number]

## 2011-01-27 LAB — POCT URINALYSIS DIP (DEVICE)
Nitrite: NEGATIVE
Protein, ur: 100 — AB
Urobilinogen, UA: 4 — ABNORMAL HIGH
pH: 5.5

## 2011-01-27 LAB — POCT PREGNANCY, URINE: Preg Test, Ur: NEGATIVE

## 2011-01-27 LAB — POCT RAPID STREP A: Streptococcus, Group A Screen (Direct): NEGATIVE

## 2011-06-17 ENCOUNTER — Encounter (HOSPITAL_COMMUNITY): Payer: Self-pay | Admitting: *Deleted

## 2011-06-17 ENCOUNTER — Emergency Department (HOSPITAL_COMMUNITY)
Admission: EM | Admit: 2011-06-17 | Discharge: 2011-06-17 | Disposition: A | Discharge status: Home / Self Care | Payer: Self-pay | Source: Home / Self Care

## 2011-06-17 DIAGNOSIS — H6590 Unspecified nonsuppurative otitis media, unspecified ear: Secondary | ICD-10-CM

## 2011-06-17 MED ORDER — AZITHROMYCIN 250 MG PO TABS: 250.0000 mg | ORAL_TABLET | Freq: Every day | ORAL | Status: AC

## 2011-06-17 MED ORDER — LORATADINE 10 MG PO TABS: 10.0000 mg | ORAL_TABLET | Freq: Every day | ORAL | Status: DC

## 2011-06-17 MED ORDER — PSEUDOEPHEDRINE HCL 60 MG PO TABS: 60.0000 mg | ORAL_TABLET | Freq: Four times a day (QID) | ORAL | Status: AC | PRN

## 2011-06-17 MED ORDER — IBUPROFEN 800 MG PO TABS: 800.0000 mg | ORAL_TABLET | Freq: Three times a day (TID) | ORAL | Status: AC | PRN

## 2011-06-17 NOTE — Discharge Instructions (Signed)
Please take the medications as directed.  Please return for a recheck in 3-4 days if your symptoms have not improved.  You may return to the urgent care at any time for worsening condition or any new symptoms that concern you.    Otitis Media, Adult A middle ear infection is an infection in the space behind the eardrum. The medical name for this is "otitis media." It may happen after a common cold. It is caused by a germ that starts growing in that space. You may feel swollen glands in your neck on the side of the ear infection. HOME CARE INSTRUCTIONS   Take your medicine as directed until it is gone, even if you feel better after the first few days.   Only take over-the-counter or prescription medicines for pain, discomfort, or fever as directed by your caregiver.   Occasional use of a nasal decongestant a couple times per day may help with discomfort and help the eustachian tube to drain better.  Follow up with your caregiver in 10 to 14 days or as directed, to be certain that the infection has cleared. Not keeping the appointment could result in a chronic or permanent injury, pain, hearing loss and disability. If there is any problem keeping the appointment, you must call back to this facility for assistance. SEEK IMMEDIATE MEDICAL CARE IF:   You are not getting better in 2 to 3 days.   You have pain that is not controlled with medication.   You feel worse instead of better.   You cannot use the medication as directed.   You develop swelling, redness or pain around the ear or stiffness in your neck.  MAKE SURE YOU:   Understand these instructions.   Will watch your condition.   Will get help right away if you are not doing well or get worse.  Document Released: 01/18/2004 Document Revised: 12/25/2010 Document Reviewed: 11/19/2007 [facility] Patient Information 2012 [place], [place].

## 2011-06-17 NOTE — ED Provider Notes (Signed)
Medical screening examination/treatment/procedure(s) were performed by non-physician practitioner and as supervising physician I was immediately available for consultation/collaboration.   Raymond,Kaitlyn Mcgrory; MD   Kaitlyn Newlon Moreno-Coll, MD 06/17/11 2118 

## 2011-06-17 NOTE — ED Notes (Signed)
Pt  Reports  For  The  Last  Week  She  Has  Had  Sensation   Of  Earache    l  Side  With a  Feeling  Of  Some    Drainage  And  Headache        -  She  Is  Awake  As  Well  As  Alert and  Oriented  She  Is  Sitting upright on the  Exam table  She  Is  Speaking in  Complete  sentances  And  Is  In no  Severe  Distress

## 2011-06-17 NOTE — ED Provider Notes (Signed)
History     CSN: [id number]  Arrival date & time 06/17/11  1640   None     Chief Complaint  Patient presents with  . Ear Drainage    (Consider location/radiation/quality/duration/timing/severity/associated sxs/prior treatment) HPI Comments: Patient reports left ear pain x several weeks.  States she is having decreased hearing in the left ear and echoing in the ear.  Pain is described as an aching and is exacerbated by cold air and loud noise.  States she felt some drainage going down her throat that she thought was from her ear.  Denies fevers, recent nasal congestion or sinus pressure, sore throat, or cough.  Pt has no medical problems - states that she is occasionally hypertensive but only when she is in pain.    Patient is a 31 y.o. female presenting with ear drainage. The history is provided by the patient.  Ear Drainage Pertinent negatives include no chest pain and no shortness of breath.    History reviewed. No pertinent past medical history.  Past Surgical History  Procedure Date  . Cesarean section     History reviewed. No pertinent family history.  History  Substance Use Topics  . Smoking status: Not on file  . Smokeless tobacco: Not on file  . Alcohol Use:     OB History    Grav Para Term Preterm Abortions TAB SAB Ect Mult Living                  Review of Systems  Constitutional: Negative for fever.  HENT: Positive for hearing loss and ear pain. Negative for sore throat and trouble swallowing.   Respiratory: Negative for shortness of breath.   Cardiovascular: Negative for chest pain.  All other systems reviewed and are negative.    Allergies  Penicillins  Home Medications  No current outpatient prescriptions on file.  BP 144/100  Pulse 80  Temp(Src) 99.2 F (37.3 C) (Oral)  Resp 22  SpO2 99%  LMP 05/29/2011  Physical Exam  Nursing note and vitals reviewed. Constitutional: She is oriented to person, place, and time. She appears  well-developed and well-nourished. No distress.  HENT:  Head: Normocephalic and atraumatic.  Right Ear: Tympanic membrane, external ear and ear canal normal.  Left Ear: There is tenderness. No drainage. No mastoid tenderness. Tympanic membrane is bulging. Tympanic membrane is not injected, not perforated and not retracted. A middle ear effusion is present.  Nose: No rhinorrhea. Left sinus exhibits maxillary sinus tenderness.  Mouth/Throat: Uvula is midline and oropharynx is clear and moist. No uvula swelling. No oropharyngeal exudate, posterior oropharyngeal edema or tonsillar abscesses.  Neck: Trachea normal, normal range of motion and phonation normal. Neck supple. No tracheal tenderness present. No rigidity. No tracheal deviation and normal range of motion present.  Cardiovascular: Normal rate and regular rhythm.   Pulmonary/Chest: Effort normal and breath sounds normal. No stridor.  Neurological: She is alert and oriented to person, place, and time.  Skin: She is not diaphoretic.    ED Course  Procedures (including critical care time)  Labs Reviewed - No data to display No results found.   1. Otitis media with effusion       MDM  Nontoxic afebrile patient with left year pain and sensation of decreased or altered hearing for several weeks.  Patient has several small children who have all been recent sick contacts.  On exam, pt has midear effusion on left and left maxillary sinus tenderness.  Likely eustachian tube dysfunction though  patient has tried antihistamines and decongestants at home without improvement.  I have advised continued trials of antihistamine and decongestant, and have added an antibiotic given her level of sick contacts and left maxillary tenderness.  Discussed hypertension as a response to pain, patient is aware that her blood pressure is high today and states this is not normally the case for her.  No CP, SOB, AMS.   Pt d/c home.  Patient verbalizes understanding  and agrees with plan.          Dillard Cannon [place], [place] 06/17/11 [phone]

## 2012-08-11 ENCOUNTER — Emergency Department (INDEPENDENT_AMBULATORY_CARE_PROVIDER_SITE_OTHER)
Admission: EM | Admit: 2012-08-11 | Discharge: 2012-08-11 | Disposition: A | Discharge status: Home / Self Care | Payer: Self-pay | Source: Home / Self Care | Attending: Family Medicine | Admitting: Family Medicine

## 2012-08-11 ENCOUNTER — Encounter (HOSPITAL_COMMUNITY): Payer: Self-pay | Admitting: Emergency Medicine

## 2012-08-11 DIAGNOSIS — M2669 Other specified disorders of temporomandibular joint: Secondary | ICD-10-CM

## 2012-08-11 DIAGNOSIS — M26629 Arthralgia of temporomandibular joint, unspecified side: Secondary | ICD-10-CM

## 2012-08-11 MED ORDER — DICLOFENAC POTASSIUM 50 MG PO TABS: 50.0000 mg | ORAL_TABLET | Freq: Three times a day (TID) | ORAL | Status: DC

## 2012-08-11 NOTE — ED Provider Notes (Signed)
History     CSN: [id number]  Arrival date & time 08/11/12  1458   First MD Initiated Contact with Patient 08/11/12 [phone]      Chief Complaint  Patient presents with  . Otalgia    (Consider location/radiation/quality/duration/timing/severity/associated sxs/prior treatment) Patient is a 32 y.o. female presenting with ear pain. The history is provided by the patient.  Otalgia Location:  Left Quality:  Sharp Severity:  Mild Duration:  10 days Timing:  Constant Progression:  Unchanged Chronicity:  New Associated symptoms: no congestion, no ear discharge, no neck pain, no rhinorrhea and no sore throat     History reviewed. No pertinent past medical history.  Past Surgical History  Procedure Laterality Date  . Cesarean section    . Tubal ligation      No family history on file.  History  Substance Use Topics  . Smoking status: Never Smoker   . Smokeless tobacco: Not on file  . Alcohol Use: No    OB History   Grav Para Term Preterm Abortions TAB SAB Ect Mult Living                  Review of Systems  Constitutional: Negative.   HENT: Positive for ear pain and dental problem. Negative for congestion, sore throat, rhinorrhea, mouth sores, neck pain, sinus pressure and ear discharge.   Musculoskeletal: Negative.   Skin: Negative.     Allergies  Penicillins  Home Medications   Current Outpatient Rx  Name  Route  Sig  Dispense  Refill  . ciprofloxacin-dexamethasone (CIPRODEX) otic suspension      4 drops 2 (two) times daily.         . Diphenhydramine-APAP, sleep, (TYLENOL PM EXTRA STRENGTH PO)   Oral   Take by mouth.         Marland Kitchen ibuprofen (ADVIL,MOTRIN) 200 MG tablet   Oral   Take 200 mg by mouth every 6 (six) hours as needed for pain.         Marland Kitchen diclofenac (CATAFLAM) 50 MG tablet   Oral   Take 1 tablet (50 mg total) by mouth 3 (three) times daily. For jaw pain.   30 tablet   0   . EXPIRED: loratadine (CLARITIN) 10 MG tablet   Oral   Take 1  tablet (10 mg total) by mouth daily.   30 tablet   0     BP 144/99  Pulse 61  Temp(Src) 98.9 F (37.2 C) (Oral)  Resp 12  SpO2 100%  LMP 08/08/2012  Physical Exam  Nursing note and vitals reviewed. Constitutional: She is oriented to person, place, and time. She appears well-developed and well-nourished. No distress.  HENT:  Head: Normocephalic.  Right Ear: External ear normal.  Left Ear: External ear normal.  Mouth/Throat: Oropharynx is clear and moist.  Left tmj soreness reproducing sx to palpation and jaw opening,.  Eyes: Conjunctivae are normal. Pupils are equal, round, and reactive to light.  Neck: Normal range of motion. Neck supple.  Lymphadenopathy:    She has no cervical adenopathy.  Neurological: She is alert and oriented to person, place, and time.  Skin: Skin is warm and dry.    ED Course  Procedures (including critical care time)  Labs Reviewed - No data to display No results found.   1. TMJ syndrome       MDM          Linna Hoff, MD 08/11/12 [phone]

## 2012-08-11 NOTE — ED Notes (Signed)
Vitals obtains by [employer]

## 2012-08-11 NOTE — ED Notes (Signed)
Reports both ears are hurting, left worse than right.  Onset 08/01/12.  Used family members eardrops-ciprodex-has used them 2 times a day for a week, no improvement, left ear did get worse.  Reports runny nose during this week, no cough.

## 2012-12-23 ENCOUNTER — Emergency Department (HOSPITAL_COMMUNITY): Payer: Self-pay

## 2012-12-23 ENCOUNTER — Encounter (HOSPITAL_COMMUNITY): Payer: Self-pay

## 2012-12-23 ENCOUNTER — Emergency Department (HOSPITAL_COMMUNITY)
Admission: EM | Admit: 2012-12-23 | Discharge: 2012-12-24 | Disposition: A | Discharge status: Home / Self Care | Payer: Self-pay | Attending: Emergency Medicine | Admitting: Emergency Medicine

## 2012-12-23 ENCOUNTER — Encounter (HOSPITAL_COMMUNITY): Payer: Self-pay | Admitting: Emergency Medicine

## 2012-12-23 ENCOUNTER — Emergency Department (INDEPENDENT_AMBULATORY_CARE_PROVIDER_SITE_OTHER)
Admission: EM | Admit: 2012-12-23 | Discharge: 2012-12-23 | Disposition: A | Discharge status: Nursing Home (Includes ICF) | Payer: Self-pay | Source: Home / Self Care | Attending: Family Medicine | Admitting: Family Medicine

## 2012-12-23 DIAGNOSIS — R059 Cough, unspecified: Secondary | ICD-10-CM | POA: Insufficient documentation

## 2012-12-23 DIAGNOSIS — R110 Nausea: Secondary | ICD-10-CM

## 2012-12-23 DIAGNOSIS — R079 Chest pain, unspecified: Secondary | ICD-10-CM

## 2012-12-23 DIAGNOSIS — R61 Generalized hyperhidrosis: Secondary | ICD-10-CM

## 2012-12-23 DIAGNOSIS — Z880 Allergy status to penicillin: Secondary | ICD-10-CM | POA: Insufficient documentation

## 2012-12-23 DIAGNOSIS — R072 Precordial pain: Secondary | ICD-10-CM | POA: Insufficient documentation

## 2012-12-23 DIAGNOSIS — R05 Cough: Secondary | ICD-10-CM | POA: Insufficient documentation

## 2012-12-23 DIAGNOSIS — Z7982 Long term (current) use of aspirin: Secondary | ICD-10-CM | POA: Insufficient documentation

## 2012-12-23 LAB — CBC
HCT: 35.4 % — ABNORMAL LOW (ref 36.0–46.0)
MCV: 81.4 fL (ref 78.0–100.0)
RBC: 4.35 MIL/uL (ref 3.87–5.11)
WBC: 11.9 10*3/uL — ABNORMAL HIGH (ref 4.0–10.5)

## 2012-12-23 LAB — BASIC METABOLIC PANEL
BUN: 14 mg/dL (ref 6–23)
CO2: 24 mEq/L (ref 19–32)
Chloride: 105 mEq/L (ref 96–112)
Creatinine, Ser: 0.68 mg/dL (ref 0.50–1.10)

## 2012-12-23 LAB — D-DIMER, QUANTITATIVE: D-Dimer, Quant: 0.46 ug/mL-FEU (ref 0.00–0.48)

## 2012-12-23 MED ORDER — ASPIRIN 81 MG PO CHEW
CHEWABLE_TABLET | ORAL | Status: AC
  Filled 2012-12-23: qty 4

## 2012-12-23 MED ORDER — ASPIRIN 81 MG PO CHEW
324.0000 mg | CHEWABLE_TABLET | Freq: Once | ORAL | Status: AC
  Administered 2012-12-23: 324 mg via ORAL

## 2012-12-23 MED ORDER — SODIUM CHLORIDE 0.9 % IV SOLN: Freq: Once | INTRAVENOUS | Status: DC

## 2012-12-23 MED ORDER — NITROGLYCERIN 0.4 MG SL SUBL: 0.4000 mg | SUBLINGUAL_TABLET | SUBLINGUAL | Status: DC | PRN

## 2012-12-23 MED ORDER — ONDANSETRON HCL 4 MG/2ML IJ SOLN
INTRAMUSCULAR | Status: AC
  Filled 2012-12-23: qty 2

## 2012-12-23 NOTE — ED Provider Notes (Signed)
CSN: [id number]     Arrival date & time 12/23/12  2026 History   First MD Initiated Contact with Patient 12/23/12 2033     Chief Complaint  Patient presents with  . Chest Pain   (Consider location/radiation/quality/duration/timing/severity/associated sxs/prior Treatment) The history is provided by the patient.  Kaitlyn Raymond is a 32 y.o. female here with chest pain. Chest pain around 3 PM today and is substernal. She has been coughing since yesterday with nonproductive cough. Today she had chest pain for 3 PM with associated shortness of breath or diaphoresis. Some pain radiates in the back as well. Denies any history of CAD. She did have some long car trips but not on OCP. Had tubal ligation in the past. Went to urgent care, given ASA 324mg , nitro, pain improved. Sent for eval.    History reviewed. No pertinent past medical history. Past Surgical History  Procedure Laterality Date  . Cesarean section    . Tubal ligation     No family history on file. History  Substance Use Topics  . Smoking status: Never Smoker   . Smokeless tobacco: Never Used  . Alcohol Use: No   OB History   Grav Para Term Preterm Abortions TAB SAB Ect Mult Living                 Review of Systems  Cardiovascular: Positive for chest pain.  All other systems reviewed and are negative.    Allergies  Penicillins  Home Medications   Current Outpatient Rx  Name  Route  Sig  Dispense  Refill  . aspirin 325 MG tablet   Oral   Take 325 mg by mouth daily.         Marland Kitchen ibuprofen (ADVIL,MOTRIN) 200 MG tablet   Oral   Take 200 mg by mouth every 6 (six) hours as needed for pain.         . nitroGLYCERIN (NITROSTAT) 0.4 MG SL tablet   Sublingual   Place 0.4 mg under the tongue every 5 (five) minutes as needed for chest pain.          BP 120/76  Pulse 71  Temp(Src) 97.9 F (36.6 C) (Oral)  Resp 18  SpO2 97%  LMP 12/09/2012 Physical Exam  Nursing note and vitals reviewed. Constitutional: She  is oriented to person, place, and time. She appears well-developed and well-nourished.  HENT:  Head: Normocephalic.  Mouth/Throat: Oropharynx is clear and moist.  Eyes: Conjunctivae are normal. Pupils are equal, round, and reactive to light.  Neck: Normal range of motion. Neck supple.  Cardiovascular: Normal rate, regular rhythm and normal heart sounds.   Pulmonary/Chest: Effort normal and breath sounds normal. No respiratory distress. She has no wheezes. She has no rales.  ? Reproducible   Abdominal: Soft. Bowel sounds are normal. She exhibits no distension. There is no tenderness. There is no rebound.  Musculoskeletal: Normal range of motion.  Neurological: She is alert and oriented to person, place, and time.  Skin: Skin is warm.  Psychiatric: She has a normal mood and affect. Her behavior is normal. Judgment and thought content normal.    ED Course  Procedures (including critical care time) Labs Review Labs Reviewed  CBC - Abnormal; Notable for the following:    WBC 11.9 (*)    HCT 35.4 (*)    All other components within normal limits  BASIC METABOLIC PANEL  D-DIMER, QUANTITATIVE  TROPONIN I  POCT I-STAT TROPONIN I   Imaging Review Dg  Chest 2 View  12/23/2012   *RADIOLOGY REPORT*  Clinical Data: Chest pain  CHEST - 2 VIEW  Comparison: 03/01/2009  Findings: Upper-normal size of cardiac silhouette. Mediastinal contours and pulmonary vascularity normal. Lungs clear. No pleural effusion or pneumothorax. Bones unremarkable.  IMPRESSION: No acute abnormalities.   Original Report Authenticated By: Ulyses Southward, M.D.     Date: 12/23/2012  Rate: 66  Rhythm: normal sinus rhythm  QRS Axis: normal  Intervals: normal  ST/T Wave abnormalities: nonspecific ST changes  Conduction Disutrbances:none  Narrative Interpretation:   Old EKG Reviewed: unchanged    MDM  No diagnosis found. Kaitlyn Raymond is a 32 y.o. female here with chest pain. Given family hx of CAD, will get trop x 2.  Given recent travel will get d-dimer. Pain free now, will reassess.   11:30 PM Trop neg x 1. D-dimer nl. I signed out to Dr. Dierdre Highman to get second troponin. If neg, can d/c home.     Richardean Canal, MD 12/23/12 [phone]

## 2012-12-23 NOTE — ED Notes (Signed)
[facility] has been called.  

## 2012-12-23 NOTE — ED Notes (Signed)
Patient reports left calf pain x 1 week. Denies any injury or trauma.

## 2012-12-23 NOTE — ED Provider Notes (Signed)
CSN: [id number]     Arrival date & time 12/23/12  1830 History   None    Chief Complaint  Patient presents with  . Chest Pain   (Consider location/radiation/quality/duration/timing/severity/associated sxs/prior Treatment) HPI Comments: 32 year old female presents complaining of chest pain, shortness of breath, nausea, diaphoresis, lightheadedness that started at 3:00 this afternoon, getting progressively worse. The pain started after lunch when she was walking around and radiates straight through to her mid back. She also noted some pain in her leg that began at the same time. She has no history of coronary artery disease or other cardiovascular problems. She does have some recent long car trips but no recent air travel. Does not take hormonal birth control and does not smoke.  Patient is a 32 y.o. female presenting with chest pain.  Chest Pain Associated symptoms: diaphoresis, nausea and shortness of breath   Associated symptoms: no abdominal pain, no cough, no dizziness, no fever, no palpitations, not vomiting and no weakness     History reviewed. No pertinent past medical history. Past Surgical History  Procedure Laterality Date  . Cesarean section    . Tubal ligation     No family history on file. History  Substance Use Topics  . Smoking status: Never Smoker   . Smokeless tobacco: Not on file  . Alcohol Use: No   OB History   Grav Para Term Preterm Abortions TAB SAB Ect Mult Living                 Review of Systems  Constitutional: Positive for diaphoresis. Negative for fever and chills.  Eyes: Negative for visual disturbance.  Respiratory: Positive for shortness of breath. Negative for cough.   Cardiovascular: Positive for chest pain. Negative for palpitations and leg swelling.  Gastrointestinal: Positive for nausea. Negative for vomiting and abdominal pain.  Endocrine: Negative for polydipsia and polyuria.  Genitourinary: Negative for dysuria, urgency and frequency.   Musculoskeletal: Negative for myalgias and arthralgias.  Skin: Negative for rash.  Neurological: Positive for light-headedness. Negative for dizziness and weakness.  Psychiatric/Behavioral: The patient is nervous/anxious.     Allergies  Penicillins  Home Medications   Current Outpatient Rx  Name  Route  Sig  Dispense  Refill  . ciprofloxacin-dexamethasone (CIPRODEX) otic suspension      4 drops 2 (two) times daily.         . diclofenac (CATAFLAM) 50 MG tablet   Oral   Take 1 tablet (50 mg total) by mouth 3 (three) times daily. For jaw pain.   30 tablet   0   . Diphenhydramine-APAP, sleep, (TYLENOL PM EXTRA STRENGTH PO)   Oral   Take by mouth.         Marland Kitchen ibuprofen (ADVIL,MOTRIN) 200 MG tablet   Oral   Take 200 mg by mouth every 6 (six) hours as needed for pain.         Marland Kitchen EXPIRED: loratadine (CLARITIN) 10 MG tablet   Oral   Take 1 tablet (10 mg total) by mouth daily.   30 tablet   0    BP 165/106  Pulse 82  Temp(Src) 98.5 F (36.9 C) (Oral)  Resp 18  SpO2 98%  LMP 12/09/2012 Physical Exam  Nursing note and vitals reviewed. Constitutional: She is oriented to person, place, and time. She appears well-developed and well-nourished. No distress.  Morbidly obese body habitus  HENT:  Head: Normocephalic and atraumatic.  Neurological: She is alert and oriented to person, place, and time.  Skin: Skin is warm. No rash noted. She is diaphoretic (mildl).  Psychiatric: She has a normal mood and affect. Judgment normal.    ED Course  Procedures (including critical care time) Labs Review Labs Reviewed - No data to display Imaging Review No results found.  MDM   1. Chest pain   2. Nausea   3. Diaphoresis    32 year old female with chest pain radiating to the back, nausea, diaphoresis, lightheadedness. Transferred to the emergency department via [facility]    Graylon Good, PA-C 12/23/12 1906

## 2012-12-23 NOTE — ED Provider Notes (Signed)
Medical screening examination/treatment/procedure(s) were performed by resident physician or non-physician practitioner and as supervising physician I was immediately available for consultation/collaboration.   Damarious Holtsclaw DOUGLAS MD.   Jheri Mitter D Gjon Letarte, MD 12/23/12 2053 

## 2012-12-23 NOTE — ED Notes (Signed)
Patient transported to X-ray 

## 2012-12-23 NOTE — ED Notes (Signed)
1 unsuccessful IV attempt; left hand... Nita Sells, RN asked to assist.

## 2012-12-23 NOTE — ED Notes (Signed)
Attempted x 1 in R AC with 20 g angiocath.  Kristen EMT attempted x 1 in R forearm with 22 g angiocath.

## 2012-12-23 NOTE — ED Notes (Signed)
Patient presents from [facility] via [facility] with c/o midsternal CP that radiates to back. Chest pain started around 1500 today while ambulating at work. Associated with diaphoresis, nausea and lightheadedness. Received ASA 324 mg and EKG at [facility]. Per [facility], patient IV 20g RH with NS @ KVO. O2 via [place] @ 2L. Zofran 4 mg. Nitro 0.4 mg Sl x 1. BP at [facility] 186/109. BP with [facility] 172/113. HR 73. Pain decreased from 8/10 to 5/10. Patient has no significant medical hx. AAOx4, resp e/u, NAD.

## 2012-12-23 NOTE — ED Notes (Signed)
Pt c/o CP, SOB, nauseas, lighted headed, and diaphoretic onset 1500  BP now is 165/106... No hx of CAD She is alert w/no signs of acute distress.

## 2012-12-23 NOTE — ED Notes (Signed)
Phlebotomy notified for D-dimer

## 2012-12-24 ENCOUNTER — Encounter (HOSPITAL_COMMUNITY): Payer: Self-pay | Admitting: Emergency Medicine

## 2012-12-24 ENCOUNTER — Emergency Department (INDEPENDENT_AMBULATORY_CARE_PROVIDER_SITE_OTHER)
Admission: EM | Admit: 2012-12-24 | Discharge: 2012-12-24 | Disposition: A | Discharge status: Home / Self Care | Payer: Self-pay | Source: Home / Self Care | Attending: Family Medicine | Admitting: Family Medicine

## 2012-12-24 DIAGNOSIS — I10 Essential (primary) hypertension: Secondary | ICD-10-CM

## 2012-12-24 LAB — TROPONIN I: Troponin I: <0.3 ng/mL (ref <0.30)

## 2012-12-24 MED ORDER — CLONIDINE HCL 0.1 MG PO TABS: 0.1000 mg | ORAL_TABLET | Freq: Two times a day (BID) | ORAL | Status: DC

## 2012-12-24 MED ORDER — HYDROCHLOROTHIAZIDE 12.5 MG PO TABS: 12.5000 mg | ORAL_TABLET | Freq: Every day | ORAL | Status: DC

## 2012-12-24 NOTE — ED Notes (Signed)
Blood pressure still elevated. Was released this a.m at 2 this morning. Pt went into work was told she didn't look right bp was 152/104.  Having migraines.  Feeling discomfort in chest. Denies sob. No hx of hypertension.

## 2012-12-24 NOTE — ED Provider Notes (Signed)
CSN: [id number]     Arrival date & time 12/24/12  1243 History   First MD Initiated Contact with Patient 12/24/12 1316     Chief Complaint  Patient presents with  . Hypertension    seen last night.    (Consider location/radiation/quality/duration/timing/severity/associated sxs/prior Treatment) Patient is a 32 y.o. female presenting with hypertension. The history is provided by the patient.  Hypertension This is a new problem. The current episode started yesterday. The problem has been gradually worsening. Pertinent negatives include no chest pain, no abdominal pain, no headaches and no shortness of breath.    History reviewed. No pertinent past medical history. Past Surgical History  Procedure Laterality Date  . Cesarean section    . Tubal ligation     History reviewed. No pertinent family history. History  Substance Use Topics  . Smoking status: Never Smoker   . Smokeless tobacco: Never Used  . Alcohol Use: No   OB History   Grav Para Term Preterm Abortions TAB SAB Ect Mult Living                 Review of Systems  Constitutional: Negative.   Respiratory: Negative for chest tightness, shortness of breath and wheezing.   Cardiovascular: Negative for chest pain, palpitations and leg swelling.  Gastrointestinal: Negative for abdominal pain.  Neurological: Negative for dizziness, speech difficulty, light-headedness and headaches.    Allergies  Penicillins  Home Medications   Current Outpatient Rx  Name  Route  Sig  Dispense  Refill  . nitroGLYCERIN (NITROSTAT) 0.4 MG SL tablet   Sublingual   Place 0.4 mg under the tongue every 5 (five) minutes as needed for chest pain.         Marland Kitchen aspirin 325 MG tablet   Oral   Take 325 mg by mouth daily.         . cloNIDine (CATAPRES) 0.1 MG tablet   Oral   Take 1 tablet (0.1 mg total) by mouth 2 (two) times daily.   60 tablet   1   . hydrochlorothiazide (HYDRODIURIL) 12.5 MG tablet   Oral   Take 1 tablet (12.5 mg total)  by mouth daily.   30 tablet   1   . ibuprofen (ADVIL,MOTRIN) 200 MG tablet   Oral   Take 200 mg by mouth every 6 (six) hours as needed for pain.          BP 160/107  Pulse 74  Temp(Src) 98.4 F (36.9 C) (Oral)  Resp 20  SpO2 100%  LMP 12/09/2012 Physical Exam  Nursing note and vitals reviewed. Constitutional: She is oriented to person, place, and time. She appears well-developed and well-nourished. No distress.  HENT:  Head: Normocephalic.  Eyes: Conjunctivae are normal. Pupils are equal, round, and reactive to light.  Neck: Normal range of motion. Neck supple.  Cardiovascular: Normal rate, regular rhythm, normal heart sounds and intact distal pulses.   Pulmonary/Chest: Breath sounds normal.  Neurological: She is alert and oriented to person, place, and time.  Skin: Skin is warm and dry.    ED Course  Procedures (including critical care time) Labs Review Labs Reviewed - No data to display Imaging Review Dg Chest 2 View  12/23/2012   *RADIOLOGY REPORT*  Clinical Data: Chest pain  CHEST - 2 VIEW  Comparison: 03/01/2009  Findings: Upper-normal size of cardiac silhouette. Mediastinal contours and pulmonary vascularity normal. Lungs clear. No pleural effusion or pneumothorax. Bones unremarkable.  IMPRESSION: No acute abnormalities.   Original  Report Authenticated By: Ulyses Southward, M.D.    MDM   1. Hypertension       Linna Hoff, MD 12/24/12 1425

## 2013-05-26 ENCOUNTER — Other Ambulatory Visit: Payer: Self-pay | Admitting: Internal Medicine

## 2013-05-26 DIAGNOSIS — H9192 Unspecified hearing loss, left ear: Secondary | ICD-10-CM

## 2013-05-26 DIAGNOSIS — R42 Dizziness and giddiness: Secondary | ICD-10-CM

## 2013-06-04 ENCOUNTER — Ambulatory Visit
Admission: RE | Admit: 2013-06-04 | Discharge: 2013-06-04 | Disposition: A | Discharge status: Home / Self Care | Payer: BC Managed Care – PPO | Source: Ambulatory Visit | Attending: Internal Medicine | Admitting: Internal Medicine

## 2013-06-04 DIAGNOSIS — R42 Dizziness and giddiness: Secondary | ICD-10-CM

## 2013-06-04 DIAGNOSIS — H9192 Unspecified hearing loss, left ear: Secondary | ICD-10-CM

## 2013-06-04 MED ORDER — GADOBENATE DIMEGLUMINE 529 MG/ML IV SOLN
20.0000 mL | Freq: Once | INTRAVENOUS | Status: AC | PRN
  Administered 2013-06-04: 20 mL via INTRAVENOUS

## 2013-07-07 ENCOUNTER — Telehealth: Payer: Self-pay | Admitting: Neurology

## 2013-07-18 ENCOUNTER — Ambulatory Visit: Payer: BC Managed Care – PPO | Admitting: Neurology

## 2013-07-19 ENCOUNTER — Ambulatory Visit (INDEPENDENT_AMBULATORY_CARE_PROVIDER_SITE_OTHER): Payer: BC Managed Care – PPO | Admitting: Neurology

## 2013-07-19 ENCOUNTER — Encounter: Payer: Self-pay | Admitting: Neurology

## 2013-07-19 VITALS — BP 132/80 | HR 76 | Temp 98.0°F | Ht 73.0 in | Wt 254.0 lb

## 2013-07-19 DIAGNOSIS — R200 Anesthesia of skin: Secondary | ICD-10-CM | POA: Insufficient documentation

## 2013-07-19 DIAGNOSIS — R519 Headache, unspecified: Secondary | ICD-10-CM | POA: Insufficient documentation

## 2013-07-19 DIAGNOSIS — R209 Unspecified disturbances of skin sensation: Secondary | ICD-10-CM

## 2013-07-19 DIAGNOSIS — H9190 Unspecified hearing loss, unspecified ear: Secondary | ICD-10-CM

## 2013-07-19 DIAGNOSIS — R51 Headache: Secondary | ICD-10-CM | POA: Insufficient documentation

## 2013-07-19 MED ORDER — FLURBIPROFEN 50 MG PO TABS: ORAL_TABLET | ORAL | Status: DC

## 2013-07-19 NOTE — Progress Notes (Signed)
NEUROLOGY CONSULTATION NOTE  Kaitlyn Raymond MRN: [id number] DOB: Sep 03, 1980  Referring provider: Dr. Crist Infante Primary care provider: Dr. Crist Infante  Reason for consult:  Facial numbness, dizziness, headache, abnormal brain MRI report  Dear Dr Joylene Draft:  Thank you for your kind referral of Kaitlyn Raymond for consultation of the above symptoms. Although her history is well known to you, please allow me to reiterate it for the purpose of our medical record. Records and images were personally reviewed where available.    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  She reports having similar episodes of dizziness, described as lightheadedness when she was in high school.  She would feel a sensation coming on in the back of her neck, "just did not feel right," as if she would pass out, sit down on the floor, with blurred vision, diaphoresis, and nausea, lasting for 15 minutes.  She would feel tired after.  This was occurring on and off for a year, then symptoms resolved for several years until age 55 while she was in the mall sitting on a chair when she had the same feeling come over her, recalls standing up to go outside, then lost consciousness. When she came to, she could hear people around her but could not speak. She felt her eyes were heavy.  She refused to go to the ER due to lack of insurance.  After that, she again had the same intermittent symptoms lasting for a year.  She got pregnant the year after and had not further symptoms until July 2014 when the same episodes occurred again.  However, over the past 2 months, the episodes have increased in frequency, occurring twice a week on average.  She usually waits until the symptoms pass, typically she is standing up, one time it occurred while sitting.  They have not occurred when lying down.  This time  though, symptoms were associated with heaviness in her head and pain in the left parietal region radiating into her left ear.  Last week she reports significant head pain where she felt it would explode.  This was associated with nausea, yellow spots in her vision, photo and phonophobia.  She took Aleve which took the edge off.  She had to take Benadryl as well.  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  She also now has constant non-pulsatile high-pitched tinnitus in the left ear, and low-pitched tinnitus in the right ear. She also has fullness in the left ear, feeling the need to "pop it."  She has been told hearing loss may be due to EVA (enlarged vestibular aqueducts), a genetic disorder which her daughter has.    Over the past year, she has had recurrent episodes of left facial numbness, as well as heaviness in the left arm and leg.  These are not necessarily associated with the dizzy and headache episodes.  She denies any numbness or tingling in the arm or leg.  No clear weakness, slured speech, diplopia, significant neck/back pain, bowel or bladder dysfunction.  She denies any olfactory or gustatory hallucinations, myoclonic jerks, staring/unresponsive episodes, gaps in time, although she has been more forgetful lately.  She denies frequent headaches in the past, although recalls having significant headaches when she was pregnant, different from her current headaches.  I personally reviewed her MRI  brain with and without contrast, which was normal.  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 Nevada Crane who reviewed prior head CTs since 2004.  There is mild undulating exostosis in the posterior fossa, including near the IAC's - but with NO IAC stenosis.  The remaining skullbase foramina visualized on the axial CT images  appear normal. There has been no significant progression of these findings  since 2004, strongly arguing against the diagnosis, and felt consistent with a normal anatomic variant.  PAST MEDICAL HISTORY: Past Medical History  Diagnosis Date  . Headache(784.0)     PAST SURGICAL HISTORY: Past Surgical History  Procedure Laterality Date  . Cesarean section    . Tubal ligation      MEDICATIONS: Current Outpatient Prescriptions on File Prior to Visit  Medication Sig Dispense Refill  . aspirin 325 MG tablet Take 325 mg by mouth daily.      . cloNIDine (CATAPRES) 0.1 MG tablet Take 1 tablet (0.1 mg total) by mouth 2 (two) times daily.  60 tablet  1  . hydrochlorothiazide (HYDRODIURIL) 12.5 MG tablet Take 1 tablet (12.5 mg total) by mouth daily.  30 tablet  1  . ibuprofen (ADVIL,MOTRIN) 200 MG tablet Take 200 mg by mouth every 6 (six) hours as needed for pain.      . nitroGLYCERIN (NITROSTAT) 0.4 MG SL tablet Place 0.4 mg under the tongue every 5 (five) minutes as needed for chest pain.       No current facility-administered medications on file prior to visit.    ALLERGIES: Allergies  Allergen Reactions  . Penicillins     FAMILY HISTORY: History reviewed. No pertinent family history.  SOCIAL HISTORY: History   Social History  . Marital Status: Divorced    Spouse Name: N/A    Number of Children: N/A  . Years of Education: N/A   Occupational History  . Not on file.   Social History Main Topics  . Smoking status: Never Smoker   . Smokeless tobacco: Never Used  . Alcohol Use: No  . Drug Use: No  . Sexual Activity: Yes    Birth Control/ Protection: Surgical   Other Topics Concern  . Not on file   Social History Narrative  . No narrative on file    REVIEW OF SYSTEMS: Constitutional: No fevers, chills, or sweats, no generalized fatigue, change in appetite Eyes: as above Ear, nose and throat: + hearing loss, ear pain, no nasal congestion, sore throat Cardiovascular: No chest pain, palpitations Respiratory:  No shortness of breath at rest or  with exertion, wheezes GastrointestinaI: No vomiting, diarrhea, abdominal pain, fecal incontinence Genitourinary:  No dysuria, urinary retention or frequency Musculoskeletal:  occl neck pain, no back pain Integumentary: No rash, pruritus, skin lesions Neurological: as above Psychiatric: No depression, insomnia, anxiety Endocrine: No palpitations, fatigue, diaphoresis, mood swings, change in appetite, change in weight, increased thirst Hematologic/Lymphatic:  No anemia, purpura, petechiae. Allergic/Immunologic: no itchy/runny eyes, nasal congestion, recent allergic reactions, rashes  PHYSICAL EXAM: Filed Vitals:   07/19/13 1004  BP: 132/80  Pulse: 76  Temp: 98 F (36.7 C)   General: No acute distress Head:  Normocephalic/atraumatic Neck: supple, no paraspinal tenderness, full range of motion Back: No paraspinal tenderness Heart: regular rate and rhythm Lungs: Clear to auscultation bilaterally. Vascular: No carotid bruits. Skin/Extremities: No rash, no edema Neurological Exam: Mental status: alert and oriented to person, place, and time, no dysarthria or aphasia, Fund of  knowledge is appropriate.  Recent and remote memory are intact.  Attention and concentration are normal.    Able to name objects and repeat phrases. Cranial nerves: CN I: not tested CN II: pupils equal, round and reactive to light, visual fields intact, fundi unremarkable. CN III, IV, VI:  full range of motion, no nystagmus, no ptosis CN V: decreased pin and cold on the left V2 and V3 distribution CN VII: upper and lower face symmetric CN VIII: hearing intact CN IX, X: gag intact, uvula midline CN XI: sternocleidomastoid and trapezius muscles intact CN XII: tongue midline Bulk & Tone: normal, no fasciculations. Motor: 5/5 throughout with no pronator drift. Sensation: intact to light touch, cold, pin, vibration and joint position sense.  No extinction to double simultaneous stimulation.  Romberg test  negative Deep Tendon Reflexes: +2 throughout, no ankle clonus Plantar responses: downgoing bilaterally Cerebellar: no incoordination on finger to nose, heel to shin. No dysdiadochokinesia Gait: narrow-based and steady, able to tandem walk adequately. Tremor: none  IMPRESSION: This is a 33 year old right-handed woman with a history of hypertension presenting with recurrent stereotyped episodes of lightheadedness, blurred vision, nausea, as well as episodes of left face numbness and left arm and leg heaviness.  She now has left parietal headaches and has been found to have bilateral hearing loss.  Her MRI brain with and without contrast is unremarkable.  Results from hearing test will be requested for review.  The etiology of her episodic symptoms is unclear, considerations include migraines, seizures, vertebrobasilar hypoperfusion.  Her MRI brain with contrast noted flow in the major blood vessels at the base of the brain.  A routine EEG will be ordered, an if normal, a 24-hour EEG will be helpful to further classify her symptoms.  She was given a prescription for Ansaid 50mg  BID to take as rescue medication for the headaches.  She will follow-up after the tests.  Thank you for allowing me to participate in the care of this patient. Please do not hesitate to call for any questions or concerns.   Ellouise Newer, M.D.

## 2013-07-19 NOTE — Patient Instructions (Signed)
1. Schedule routine EEG, then 24-hour EEG 2. Keep a calendar of symptoms 3. Take Ansaid 50mg  twice a day as rescue for severe headaches 4. Follow-up after tests

## 2013-07-26 ENCOUNTER — Ambulatory Visit (HOSPITAL_COMMUNITY)
Admission: RE | Admit: 2013-07-26 | Discharge: 2013-07-26 | Disposition: A | Discharge status: Home / Self Care | Payer: BC Managed Care – PPO | Source: Ambulatory Visit | Attending: Neurology | Admitting: Neurology

## 2013-07-26 DIAGNOSIS — R209 Unspecified disturbances of skin sensation: Secondary | ICD-10-CM | POA: Insufficient documentation

## 2013-07-26 DIAGNOSIS — R569 Unspecified convulsions: Secondary | ICD-10-CM

## 2013-07-26 DIAGNOSIS — R200 Anesthesia of skin: Secondary | ICD-10-CM

## 2013-07-26 DIAGNOSIS — H9190 Unspecified hearing loss, unspecified ear: Secondary | ICD-10-CM | POA: Insufficient documentation

## 2013-07-26 DIAGNOSIS — R51 Headache: Secondary | ICD-10-CM | POA: Insufficient documentation

## 2013-07-26 NOTE — Progress Notes (Signed)
EEG completed; results pending.    

## 2013-07-28 ENCOUNTER — Ambulatory Visit (HOSPITAL_COMMUNITY): Payer: BC Managed Care – PPO

## 2013-07-28 NOTE — Procedures (Signed)
ELECTROENCEPHALOGRAM REPORT  Date of Study: 07/26/2013  Patient's Name: Kaitlyn Raymond MRN: [id number] Date of Birth: 1981/04/25  Referring Provider: Dr. Ellouise Newer  Clinical History: This is a 33 year old woman with recurrent stereotyped episodes of lightheadedness, blurred vision, nausea, as well as episodes of left face numbness and left arm and leg heaviness.   Medications: Aspirin, clonidine, hydrochlorothiazide, ibuprofen  Technical Summary: A multichannel digital EEG recording measured by the international 10-20 system with electrodes applied with paste and impedances below 5000 ohms performed in our laboratory with EKG monitoring in an awake and asleep patient.  Hyperventilation and photic stimulation were performed.  The digital EEG was referentially recorded, reformatted, and digitally filtered in a variety of bipolar and referential montages for optimal display.  Spike detection software was employed.  Description: The patient is awake and asleep during the recording.  During maximal wakefulness, there is a symmetric, medium voltage 9.5-10 Hz posterior dominant rhythm that attenuates with eye opening.  The record is symmetric.  During drowsiness and sleep, there is an increase in theta slowing of the background.  Vertex waves and symmetric sleep spindles were seen.  Hyperventilation and photic stimulation did not elicit any abnormalities.  There were no epileptiform discharges or electrographic seizures seen.    EKG lead was unremarkable.  Impression: This awake and asleep EEG is normal.    Clinical Correlation: A normal EEG does not exclude a clinical diagnosis of epilepsy. If further clinical questions remain, prolonged EEG may be helpful.  Clinical correlation is advised.   Ellouise Newer, M.D.

## 2013-08-04 ENCOUNTER — Other Ambulatory Visit (HOSPITAL_COMMUNITY): Payer: BC Managed Care – PPO

## 2013-08-08 ENCOUNTER — Ambulatory Visit (HOSPITAL_COMMUNITY)
Admission: RE | Admit: 2013-08-08 | Discharge: 2013-08-08 | Disposition: A | Discharge status: Home / Self Care | Payer: BC Managed Care – PPO | Source: Ambulatory Visit | Attending: Neurology | Admitting: Neurology

## 2013-08-08 DIAGNOSIS — R209 Unspecified disturbances of skin sensation: Secondary | ICD-10-CM | POA: Insufficient documentation

## 2013-08-08 DIAGNOSIS — H9190 Unspecified hearing loss, unspecified ear: Secondary | ICD-10-CM | POA: Insufficient documentation

## 2013-08-08 DIAGNOSIS — R51 Headache: Secondary | ICD-10-CM

## 2013-08-08 DIAGNOSIS — R200 Anesthesia of skin: Secondary | ICD-10-CM

## 2014-05-29 NOTE — Telephone Encounter (Signed)
error 

## 2014-11-29 ENCOUNTER — Encounter: Payer: Self-pay | Admitting: Gastroenterology

## 2014-12-07 ENCOUNTER — Encounter: Payer: Self-pay | Admitting: Physician Assistant

## 2014-12-07 ENCOUNTER — Ambulatory Visit (INDEPENDENT_AMBULATORY_CARE_PROVIDER_SITE_OTHER): Payer: 59 | Admitting: Physician Assistant

## 2014-12-07 VITALS — BP 130/72 | HR 64 | Ht 73.0 in | Wt 325.2 lb

## 2014-12-07 DIAGNOSIS — R1013 Epigastric pain: Secondary | ICD-10-CM | POA: Diagnosis not present

## 2014-12-07 DIAGNOSIS — R12 Heartburn: Secondary | ICD-10-CM

## 2014-12-07 DIAGNOSIS — K219 Gastro-esophageal reflux disease without esophagitis: Secondary | ICD-10-CM

## 2014-12-07 DIAGNOSIS — R490 Dysphonia: Secondary | ICD-10-CM

## 2014-12-07 MED ORDER — SUCRALFATE 1 GM/10ML PO SUSP: ORAL | Status: DC

## 2014-12-07 MED ORDER — PANTOPRAZOLE SODIUM 40 MG PO TBEC: 40.0000 mg | DELAYED_RELEASE_TABLET | Freq: Two times a day (BID) | ORAL | Status: DC

## 2014-12-07 NOTE — Patient Instructions (Addendum)
Your physician has requested that you go to the basement for the following lab work before leaving today:  H pylori   We have sent the following medications to your pharmacy for you to pick up at your convenience:  Protonix, Carafate  You have been scheduled for an endoscopy. Please follow written instructions given to you at your visit today. If you use inhalers (even only as needed), please bring them with you on the day of your procedure.

## 2014-12-07 NOTE — Progress Notes (Signed)
Patient ID: SILVA AAMODT, female   DOB: 02/09/1981, 33 y.o.   MRN: [id number]   Subjective:    Patient ID: Lendon Collar, female    DOB: [birth year], 34 y.o.   MRN: [id number]  HPI Celestia is a pleasant 34 year old female new to GI, referred by Dr. Joylene Draft  Today for evaluation of refractory reflux symptoms. Patient has not had any prior GI evaluation. She is generally in good health does have history of hypertension and morbid obesity. She has been trying to lose weight and is down 51 pounds since February. She relates reflux symptoms over the past few years, she initially had been treated with Nexium which eventually stopped working. About a year ago she was started on Protonix 40 mg by mouth daily and then increase to twice daily about 8 months ago. He says this worked well for several months but is now having reflux again over the past 4-5 weeks. She describes this as a burning sensation in her throat and in her stomach and a "spasm" feeling like her esophagus is are touching together. She says she has increased symptoms with exercising and at night. She's also noticed some raspiness to her voice. Her appetite has been fine no nausea or vomiting. She's had some vague dysphagia to pills with the Protonix occasionally and with potassium. She's not had any solid food dysphagia. She's not on any regular aspirin or NSAIDs. Exline She has been on an iron supplement for several months for which she describes as a mild iron deficiency anemia. Family history is negative for colon cancer polyps or other intestinal disorders Patient is nonsmoker.  Review of Systems Pertinent positive and negative review of systems were noted in the above HPI section.  All other review of systems was otherwise negative.  Outpatient Encounter Prescriptions as of 12/07/2014  Medication Sig  . ferrous sulfate 325 (65 FE) MG tablet Take 325 mg by mouth daily with breakfast.  . losartan-hydrochlorothiazide (HYZAAR) 50-12.5 MG  per tablet Take 1 tablet by mouth daily.  . Multiple Vitamin (MULTIVITAMIN) tablet Take 1 tablet by mouth daily.  . pantoprazole (PROTONIX) 40 MG tablet Take 1 tablet (40 mg total) by mouth 2 (two) times daily.  . [DISCONTINUED] pantoprazole (PROTONIX) 40 MG tablet Take 40 mg by mouth 2 (two) times daily.  . sucralfate (CARAFATE) 1 GM/10ML suspension Take 4 times a day - between meals and at bedtime  . [DISCONTINUED] aspirin 325 MG tablet Take 325 mg by mouth daily.  . [DISCONTINUED] cloNIDine (CATAPRES) 0.1 MG tablet Take 1 tablet (0.1 mg total) by mouth 2 (two) times daily.  . [DISCONTINUED] flurbiprofen (ANSAID) 50 MG tablet Twice a day as needed for severe headaches. Do not take more than 3 times a week  . [DISCONTINUED] hydrochlorothiazide (HYDRODIURIL) 12.5 MG tablet Take 1 tablet (12.5 mg total) by mouth daily.  . [DISCONTINUED] ibuprofen (ADVIL,MOTRIN) 200 MG tablet Take 200 mg by mouth every 6 (six) hours as needed for pain.  . [DISCONTINUED] nitroGLYCERIN (NITROSTAT) 0.4 MG SL tablet Place 0.4 mg under the tongue every 5 (five) minutes as needed for chest pain.   No facility-administered encounter medications on file as of 12/07/2014.   Allergies  Allergen Reactions  . Penicillins    Patient Active Problem List   Diagnosis Date Noted  . Headache(784.0) 07/19/2013  . Numbness 07/19/2013  . Hearing loss 07/19/2013   Social History   Social History  . Marital Status: Divorced    Spouse Name: N/A  .  Number of Children: N/A  . Years of Education: N/A   Occupational History  . Not on file.   Social History Main Topics  . Smoking status: Never Smoker   . Smokeless tobacco: Never Used  . Alcohol Use: No  . Drug Use: No  . Sexual Activity: Yes    Birth Control/ Protection: Surgical   Other Topics Concern  . Not on file   Social History Narrative    Ms. Kotarski's family history includes Diabetes in her father; Heart disease in her father; Hypertension in her brother  and mother.      Objective:    Filed Vitals:   12/07/14 0928  BP: 130/72  Pulse: 64    Physical Exam   well-developed African American female in no acute distress, pleasant blood pressure 130/72 pulse 64 height 6 foot 1 weight 325, BMI 42.9. HEENT; nontraumatic normocephalic EOMI PERRLA sclera anicteric, Cardiovascular; regular rate and rhythm with S1-S2 no murmur or gallop, Pulmonary ;clear bilaterally, Abdomen, ;soft, no focal tenderness no guarding or rebound no palpable mass or hepatosplenomegaly bowel sounds are present on Rectal ;exam not done, Extremities; no clubbing cyanosis or edema skin warm and dry, Neuropsych; mood and affect appropriate      Assessment & Plan:   #1 34 yo female with chronic GERD, now with progressive sxs despite BID PPI , and epigastric burning as well R/o refractory GERD, esophagitis, eosinophilic esophagitis, Hpylori gastritis #2 obesity  #3 hx of iron deficiency -likely menorrhagia  Plan; Continue efforts at weight loss which will help  reflux sxs Strict antireflux regimen including elevation of HOB, and NPO for 2 hours before bed.  Continue BID Protonix for now Add Carafate 1 gm liquid  Ac and hs x 2-3 weeks Hyplori stool Ag Schedule for EGD with Dr Deatra Ina. Procedure discussed in detail with pt and she is agreeable to proceed.   Sascha Baugher S Deakin Lacek PA-C 12/07/2014   Cc: Crist Infante, MD

## 2014-12-07 NOTE — Progress Notes (Signed)
Reviewed and agree with management. I will refer to GI research for possible PPI study Sandy Salaam. Deatra Ina, M.D., [facility]

## 2014-12-08 ENCOUNTER — Other Ambulatory Visit: Payer: 59

## 2014-12-08 DIAGNOSIS — R12 Heartburn: Secondary | ICD-10-CM

## 2014-12-08 DIAGNOSIS — R1013 Epigastric pain: Secondary | ICD-10-CM

## 2014-12-08 DIAGNOSIS — R490 Dysphonia: Secondary | ICD-10-CM

## 2014-12-08 DIAGNOSIS — K219 Gastro-esophageal reflux disease without esophagitis: Secondary | ICD-10-CM

## 2014-12-09 LAB — HELICOBACTER PYLORI  SPECIAL ANTIGEN: H. PYLORI Antigen: NEGATIVE

## 2015-01-10 ENCOUNTER — Encounter (HOSPITAL_COMMUNITY): Payer: Self-pay | Admitting: *Deleted

## 2015-01-10 ENCOUNTER — Emergency Department (HOSPITAL_COMMUNITY): Payer: 59

## 2015-01-10 ENCOUNTER — Emergency Department (HOSPITAL_COMMUNITY)
Admission: EM | Admit: 2015-01-10 | Discharge: 2015-01-10 | Disposition: A | Discharge status: Home / Self Care | Payer: 59 | Attending: Emergency Medicine | Admitting: Emergency Medicine

## 2015-01-10 DIAGNOSIS — H9190 Unspecified hearing loss, unspecified ear: Secondary | ICD-10-CM | POA: Insufficient documentation

## 2015-01-10 DIAGNOSIS — I10 Essential (primary) hypertension: Secondary | ICD-10-CM | POA: Insufficient documentation

## 2015-01-10 DIAGNOSIS — R011 Cardiac murmur, unspecified: Secondary | ICD-10-CM | POA: Diagnosis not present

## 2015-01-10 DIAGNOSIS — K219 Gastro-esophageal reflux disease without esophagitis: Secondary | ICD-10-CM | POA: Insufficient documentation

## 2015-01-10 DIAGNOSIS — Z79899 Other long term (current) drug therapy: Secondary | ICD-10-CM | POA: Diagnosis not present

## 2015-01-10 DIAGNOSIS — M2392 Unspecified internal derangement of left knee: Secondary | ICD-10-CM

## 2015-01-10 DIAGNOSIS — M25562 Pain in left knee: Secondary | ICD-10-CM | POA: Diagnosis present

## 2015-01-10 DIAGNOSIS — Z8742 Personal history of other diseases of the female genital tract: Secondary | ICD-10-CM | POA: Diagnosis not present

## 2015-01-10 MED ORDER — OXYCODONE-ACETAMINOPHEN 5-325 MG PO TABS
1.0000 | ORAL_TABLET | Freq: Once | ORAL | Status: AC
  Administered 2015-01-10: 1 via ORAL
  Filled 2015-01-10: qty 1

## 2015-01-10 MED ORDER — HYDROCODONE-ACETAMINOPHEN 5-325 MG PO TABS: 1.0000 | ORAL_TABLET | Freq: Four times a day (QID) | ORAL | Status: DC | PRN

## 2015-01-10 MED ORDER — IBUPROFEN 800 MG PO TABS: 800.0000 mg | ORAL_TABLET | Freq: Three times a day (TID) | ORAL | Status: DC

## 2015-01-10 NOTE — ED Notes (Signed)
The pt was doing a karate kick and felt a pop in her lt knee   Anterior and posterior  Severe pain since  lmp 30 days  Tubal ligation

## 2015-01-10 NOTE — Discharge Instructions (Signed)
Your knee injury may include ligaments or meniscal injury which does not show up on xray.  Please wear knee immobilizer, use crutches, keep your leg elevated and follow up with orthopedist for further care.    Knee Pain The knee is the complex joint between your thigh and your lower leg. It is made up of bones, tendons, ligaments, and cartilage. The bones that make up the knee are:  The femur in the thigh.  The tibia and fibula in the lower leg.  The patella or kneecap riding in the groove on the lower femur. CAUSES  Knee pain is a common complaint with many causes. A few of these causes are:  Injury, such as:  A ruptured ligament or tendon injury.  Torn cartilage.  Medical conditions, such as:  Gout  Arthritis  Infections  Overuse, over training, or overdoing a physical activity. Knee pain can be minor or severe. Knee pain can accompany debilitating injury. Minor knee problems often respond well to self-care measures or get well on their own. More serious injuries may need medical intervention or even surgery. SYMPTOMS The knee is complex. Symptoms of knee problems can vary widely. Some of the problems are:  Pain with movement and weight bearing.  Swelling and tenderness.  Buckling of the knee.  Inability to straighten or extend your knee.  Your knee locks and you cannot straighten it.  Warmth and redness with pain and fever.  Deformity or dislocation of the kneecap. DIAGNOSIS  Determining what is wrong may be very straight forward such as when there is an injury. It can also be challenging because of the complexity of the knee. Tests to make a diagnosis may include:  Your caregiver taking a history and doing a physical exam.  Routine X-rays can be used to rule out other problems. X-rays will not reveal a cartilage tear. Some injuries of the knee can be diagnosed by:  Arthroscopy a surgical technique by which a small video camera is inserted through tiny incisions  on the sides of the knee. This procedure is used to examine and repair internal knee joint problems. Tiny instruments can be used during arthroscopy to repair the torn knee cartilage (meniscus).  Arthrography is a radiology technique. A contrast liquid is directly injected into the knee joint. Internal structures of the knee joint then become visible on X-ray film.  An MRI scan is a non X-ray radiology procedure in which magnetic fields and a computer produce two- or three-dimensional images of the inside of the knee. Cartilage tears are often visible using an MRI scanner. MRI scans have largely replaced arthrography in diagnosing cartilage tears of the knee.  Blood work.  Examination of the fluid that helps to lubricate the knee joint (synovial fluid). This is done by taking a sample out using a needle and a syringe. TREATMENT The treatment of knee problems depends on the cause. Some of these treatments are:  Depending on the injury, proper casting, splinting, surgery, or physical therapy care will be needed.  Give yourself adequate recovery time. Do not overuse your joints. If you begin to get sore during workout routines, back off. Slow down or do fewer repetitions.  For repetitive activities such as cycling or running, maintain your strength and nutrition.  Alternate muscle groups. For example, if you are a weight lifter, work the upper body on one day and the lower body the next.  Either tight or weak muscles do not give the proper support for your knee. Tight  or weak muscles do not absorb the stress placed on the knee joint. Keep the muscles surrounding the knee strong.  Take care of mechanical problems.  If you have flat feet, orthotics or special shoes may help. See your caregiver if you need help.  Arch supports, sometimes with wedges on the inner or outer aspect of the heel, can help. These can shift pressure away from the side of the knee most bothered by osteoarthritis.  A  brace called an "unloader" brace also may be used to help ease the pressure on the most arthritic side of the knee.  If your caregiver has prescribed crutches, braces, wraps or ice, use as directed. The acronym for this is PRICE. This means protection, rest, ice, compression, and elevation.  Nonsteroidal anti-inflammatory drugs (NSAIDs), can help relieve pain. But if taken immediately after an injury, they may actually increase swelling. Take NSAIDs with food in your stomach. Stop them if you develop stomach problems. Do not take these if you have a history of ulcers, stomach pain, or bleeding from the bowel. Do not take without your caregiver's approval if you have problems with fluid retention, heart failure, or kidney problems.  For ongoing knee problems, physical therapy may be helpful.  Glucosamine and chondroitin are over-the-counter dietary supplements. Both may help relieve the pain of osteoarthritis in the knee. These medicines are different from the usual anti-inflammatory drugs. Glucosamine may decrease the rate of cartilage destruction.  Injections of a corticosteroid drug into your knee joint may help reduce the symptoms of an arthritis flare-up. They may provide pain relief that lasts a few months. You may have to wait a few months between injections. The injections do have a small increased risk of infection, water retention, and elevated blood sugar levels.  Hyaluronic acid injected into damaged joints may ease pain and provide lubrication. These injections may work by reducing inflammation. A series of shots may give relief for as long as 6 months.  Topical painkillers. Applying certain ointments to your skin may help relieve the pain and stiffness of osteoarthritis. Ask your pharmacist for suggestions. Many over the-counter products are approved for temporary relief of arthritis pain.  In some countries, doctors often prescribe topical NSAIDs for relief of chronic conditions such as  arthritis and tendinitis. A review of treatment with NSAID creams found that they worked as well as oral medications but without the serious side effects. PREVENTION  Maintain a healthy weight. Extra pounds put more strain on your joints.  Get strong, stay limber. Weak muscles are a common cause of knee injuries. Stretching is important. Include flexibility exercises in your workouts.  Be smart about exercise. If you have osteoarthritis, chronic knee pain or recurring injuries, you may need to change the way you exercise. This does not mean you have to stop being active. If your knees ache after jogging or playing basketball, consider switching to swimming, water aerobics, or other low-impact activities, at least for a few days a week. Sometimes limiting high-impact activities will provide relief.  Make sure your shoes fit well. Choose footwear that is right for your sport.  Protect your knees. Use the proper gear for knee-sensitive activities. Use kneepads when playing volleyball or laying carpet. Buckle your seat belt every time you drive. Most shattered kneecaps occur in car accidents.  Rest when you are tired. SEEK MEDICAL CARE IF:  You have knee pain that is continual and does not seem to be getting better.  SEEK IMMEDIATE MEDICAL CARE IF:  Your knee joint feels hot to the touch and you have a high fever. MAKE SURE YOU:   Understand these instructions.  Will watch your condition.  Will get help right away if you are not doing well or get worse. Document Released: 02/09/2007 Document Revised: 07/07/2011 Document Reviewed: 02/09/2007 [facility] Patient Information 2015 [place], [place]. This information is not intended to replace advice given to you by your health care provider. Make sure you discuss any questions you have with your health care provider.

## 2015-01-10 NOTE — ED Provider Notes (Signed)
CSN: [id number]     Arrival date & time 01/10/15  1748 History  This chart was scribed for Domenic Moras, PA-C, working with Nat Christen, MD by Steva Colder, ED Scribe. The patient was seen in room TR01C/TR01C at 6:17 PM.    Chief Complaint  Patient presents with  . Knee Pain      The history is provided by the patient. No language interpreter was used.    Kaitlyn Raymond is a 34 y.o. female who presents to the Emergency Department complaining of severe left knee pain onset PTA. She notes that she was doing a round kick and she felt/heard 3 pops to her left knee while doing the kick the second time. Pt denies the knee making contact with anything. Pt notes that when she straighten her leg she heard one large pop and she was able to walk initially. Pt notes that she fell again when she was trying to get in the car. She notes that the pain was sharp unbearable pain and radiated down her leg. Pt rates her pain as 6-7/10. She notes that she has not tried any medications for the relief of her symptoms. She denies color change, wound, rash, left ankle, left hip pain, and any other symptoms. She is allergic to penicillin and she does not have an orthopedist.    Past Medical History  Diagnosis Date  . Headache(784.0)   . Allergic rhinitis   . Hypertension   . GERD (gastroesophageal reflux disease)   . Hearing loss   . Heart murmur   . Ovarian cyst    Past Surgical History  Procedure Laterality Date  . Cesarean section    . Tubal ligation Bilateral    Family History  Problem Relation Age of Onset  . Hypertension Mother   . Diabetes Father   . Heart disease Father   . Hypertension Brother     oldest brother   Social History  Substance Use Topics  . Smoking status: Never Smoker   . Smokeless tobacco: Never Used  . Alcohol Use: No   OB History    No data available     Review of Systems  Musculoskeletal: Positive for arthralgias and gait problem. Negative for joint swelling.  Skin:  Negative for color change, rash and wound.      Allergies  Penicillins  Home Medications   Prior to Admission medications   Medication Sig Start Date End Date Taking? Authorizing Provider  ferrous sulfate 325 (65 FE) MG tablet Take 325 mg by mouth daily with breakfast.    Historical Provider, MD  losartan-hydrochlorothiazide (HYZAAR) 50-12.5 MG per tablet Take 1 tablet by mouth daily.    Historical Provider, MD  Multiple Vitamin (MULTIVITAMIN) tablet Take 1 tablet by mouth daily.    Historical Provider, MD  pantoprazole (PROTONIX) 40 MG tablet Take 1 tablet (40 mg total) by mouth 2 (two) times daily. 12/07/14   Amy S Esterwood, PA-C  sucralfate (CARAFATE) 1 GM/10ML suspension Take 4 times a day - between meals and at bedtime 12/07/14   Amy S Esterwood, PA-C   BP 132/73 mmHg  Pulse 77  Temp(Src) 98.8 F (37.1 C)  Resp 20  Ht 6\' 1"  (1.854 m)  Wt 325 lb (147.419 kg)  BMI 42.89 kg/m2  SpO2 100%  LMP 12/10/2014 Physical Exam  Constitutional: She is oriented to person, place, and time. She appears well-developed and well-nourished. No distress.  HENT:  Head: Normocephalic and atraumatic.  Eyes: EOM are normal.  Neck: Neck supple.  Cardiovascular: Normal rate.   Pulmonary/Chest: Effort normal. No respiratory distress.  Abdominal: Soft. There is no tenderness.  Musculoskeletal:       Left hip: Normal.       Left knee: She exhibits decreased range of motion. She exhibits no deformity, no LCL laxity and no MCL laxity. Tenderness found. Medial joint line tenderness noted.       Left ankle: Normal.  Left knee: Point tenderness to medial joint line on palpation. Tenderness to posterior knee. Decreased flexion/extension secondary to pain. Pain with anterior/posterior drawer test. No obvious joint laxity appreciated. Pain with varus and valgus maneuver. No gross deformity noted. No bruising of knee noted. Left ankle/hip non-tenderness. Sensation and pulse intact.   Neurological: She is alert  and oriented to person, place, and time.  Skin: Skin is warm and dry.  Psychiatric: She has a normal mood and affect. Her behavior is normal.  Nursing note and vitals reviewed.   ED Course  Procedures (including critical care time) DIAGNOSTIC STUDIES: Oxygen Saturation is 100% on room air, normal by my interpretation.    COORDINATION OF CARE: 6:22 PM- This is a probable ligamentous tear of the left knee. Will obtain an xray to rule out any fractures at this time. Discussed treatment plan with pt at bedside and pt agreed to plan.   7:51 PM- Given there being no fractures on the left knee xray, will give the pt a knee immobilizer and crutches.  Imaging Review Dg Knee Complete 4 Views Left  01/10/2015   CLINICAL DATA:  Left knee injury during karate kick. Left knee pain. Initial encounter.  EXAM: LEFT KNEE - COMPLETE 4+ VIEW  COMPARISON:  10/29/2003  FINDINGS: There is no evidence of fracture, dislocation, or joint effusion. There is no evidence of arthropathy or other focal bone abnormality. Soft tissues are unremarkable.  IMPRESSION: Negative.   Electronically Signed   By: Earle Gell M.D.   On: 01/10/2015 19:33   I have personally reviewed and evaluated these images as part of my medical decision-making.   MDM   Final diagnoses:  Derangement, knee internal, left    BP 132/73 mmHg  Pulse 77  Temp(Src) 98.8 F (37.1 C)  Resp 20  Ht 6\' 1"  (1.854 m)  Wt 325 lb (147.419 kg)  BMI 42.89 kg/m2  SpO2 100%  LMP 12/10/2014  I have reviewed nursing notes and vital signs. I personally viewed the imaging tests through PACS system and agrees with radiologist's intepretation I reviewed available ER/hospitalization records through the EMR  I personally performed the services described in this documentation, which was scribed in my presence. The recorded information has been reviewed and is accurate.     Domenic Moras, PA-C 01/10/15 2002  Nat Christen, MD 01/10/15 2245

## 2015-02-06 ENCOUNTER — Ambulatory Visit: Payer: Self-pay | Admitting: Gastroenterology

## 2015-02-09 ENCOUNTER — Other Ambulatory Visit: Payer: Self-pay | Admitting: Orthopedic Surgery

## 2015-02-16 ENCOUNTER — Encounter (HOSPITAL_BASED_OUTPATIENT_CLINIC_OR_DEPARTMENT_OTHER): Payer: Self-pay | Admitting: *Deleted

## 2015-02-16 NOTE — Progress Notes (Signed)
NPO AFTER MN WITH EXCEPTION CLEAR LIQUIDS UNTIL 0700 (NO CREAM/ MILK PRODUCTS).  ARRIVE AT 1100.  NEEDS ISTAT.  CURRENT EKG, ECHO, AND STRESS TEST TO BE FAXED FROM DR Einar Gip.  WILL TAKE COZAAR AND PROTONIX AM DOS W/ SIPS OF WATER.

## 2015-02-20 ENCOUNTER — Encounter: Payer: 59 | Admitting: Gastroenterology

## 2015-02-22 ENCOUNTER — Encounter (HOSPITAL_BASED_OUTPATIENT_CLINIC_OR_DEPARTMENT_OTHER): Payer: Self-pay | Admitting: *Deleted

## 2015-02-22 ENCOUNTER — Encounter (HOSPITAL_BASED_OUTPATIENT_CLINIC_OR_DEPARTMENT_OTHER)
Admission: RE | Disposition: A | Discharge status: Home / Self Care | Payer: Self-pay | Source: Ambulatory Visit | Attending: Specialist

## 2015-02-22 ENCOUNTER — Ambulatory Visit (HOSPITAL_BASED_OUTPATIENT_CLINIC_OR_DEPARTMENT_OTHER): Payer: 59 | Admitting: Anesthesiology

## 2015-02-22 ENCOUNTER — Ambulatory Visit (HOSPITAL_BASED_OUTPATIENT_CLINIC_OR_DEPARTMENT_OTHER)
Admission: RE | Admit: 2015-02-22 | Discharge: 2015-02-22 | Disposition: A | Discharge status: Home / Self Care | Payer: 59 | Source: Ambulatory Visit | Attending: Specialist | Admitting: Specialist

## 2015-02-22 DIAGNOSIS — Z9889 Other specified postprocedural states: Secondary | ICD-10-CM

## 2015-02-22 DIAGNOSIS — S83412A Sprain of medial collateral ligament of left knee, initial encounter: Secondary | ICD-10-CM | POA: Diagnosis not present

## 2015-02-22 DIAGNOSIS — Z791 Long term (current) use of non-steroidal anti-inflammatories (NSAID): Secondary | ICD-10-CM | POA: Insufficient documentation

## 2015-02-22 DIAGNOSIS — M94262 Chondromalacia, left knee: Secondary | ICD-10-CM | POA: Insufficient documentation

## 2015-02-22 DIAGNOSIS — Z6841 Body Mass Index (BMI) 40.0 and over, adult: Secondary | ICD-10-CM | POA: Insufficient documentation

## 2015-02-22 DIAGNOSIS — Z79899 Other long term (current) drug therapy: Secondary | ICD-10-CM | POA: Diagnosis not present

## 2015-02-22 DIAGNOSIS — I10 Essential (primary) hypertension: Secondary | ICD-10-CM | POA: Diagnosis not present

## 2015-02-22 DIAGNOSIS — X58XXXA Exposure to other specified factors, initial encounter: Secondary | ICD-10-CM | POA: Insufficient documentation

## 2015-02-22 DIAGNOSIS — K219 Gastro-esophageal reflux disease without esophagitis: Secondary | ICD-10-CM | POA: Diagnosis not present

## 2015-02-22 DIAGNOSIS — S83282A Other tear of lateral meniscus, current injury, left knee, initial encounter: Secondary | ICD-10-CM | POA: Diagnosis not present

## 2015-02-22 DIAGNOSIS — S83512A Sprain of anterior cruciate ligament of left knee, initial encounter: Secondary | ICD-10-CM | POA: Insufficient documentation

## 2015-02-22 DIAGNOSIS — M25362 Other instability, left knee: Secondary | ICD-10-CM | POA: Diagnosis present

## 2015-02-22 HISTORY — DX: Sprain of medial collateral ligament of unspecified knee, initial encounter: S83.419A

## 2015-02-22 HISTORY — PX: KNEE ARTHROSCOPY WITH ANTERIOR CRUCIATE LIGAMENT (ACL) REPAIR WITH HAMSTRING GRAFT: SHX5645

## 2015-02-22 HISTORY — DX: Presence of spectacles and contact lenses: Z97.3

## 2015-02-22 HISTORY — DX: Chondromalacia, left knee: M94.262

## 2015-02-22 HISTORY — DX: Edema, unspecified: R60.9

## 2015-02-22 HISTORY — DX: Sprain of anterior cruciate ligament of left knee, initial encounter: S83.512A

## 2015-02-22 LAB — POCT I-STAT 4, (NA,K, GLUC, HGB,HCT)
Glucose, Bld: 86 mg/dL (ref 65–99)
HCT: 38 % (ref 36.0–46.0)
Hemoglobin: 12.9 g/dL (ref 12.0–15.0)
POTASSIUM: 3.9 mmol/L (ref 3.5–5.1)
SODIUM: 139 mmol/L (ref 135–145)

## 2015-02-22 SURGERY — KNEE ARTHROSCOPY WITH ANTERIOR CRUCIATE LIGAMENT (ACL) REPAIR WITH HAMSTRING GRAFT
Anesthesia: General | Site: Knee | Laterality: Left

## 2015-02-22 MED ORDER — OXYCODONE HCL 5 MG PO TABS
5.0000 mg | ORAL_TABLET | Freq: Once | ORAL | Status: AC
  Administered 2015-02-22: 5 mg via ORAL
  Filled 2015-02-22: qty 1

## 2015-02-22 MED ORDER — FENTANYL CITRATE (PF) 100 MCG/2ML IJ SOLN
INTRAMUSCULAR | Status: AC
  Filled 2015-02-22: qty 2

## 2015-02-22 MED ORDER — ONDANSETRON HCL 4 MG/2ML IJ SOLN
INTRAMUSCULAR | Status: DC | PRN
  Administered 2015-02-22: 4 mg via INTRAVENOUS

## 2015-02-22 MED ORDER — MIDAZOLAM HCL 2 MG/2ML IJ SOLN
INTRAMUSCULAR | Status: AC
  Filled 2015-02-22: qty 2

## 2015-02-22 MED ORDER — DEXAMETHASONE SODIUM PHOSPHATE 4 MG/ML IJ SOLN
INTRAMUSCULAR | Status: DC | PRN
  Administered 2015-02-22: 10 mg via INTRAVENOUS

## 2015-02-22 MED ORDER — METHOCARBAMOL 1000 MG/10ML IJ SOLN
500.0000 mg | Freq: Three times a day (TID) | INTRAVENOUS | Status: DC
  Administered 2015-02-22: 500 mg via INTRAVENOUS
  Filled 2015-02-22 (×2): qty 5

## 2015-02-22 MED ORDER — CLONIDINE HCL (ANALGESIA) 100 MCG/ML EP SOLN
EPIDURAL | Status: DC | PRN
  Administered 2015-02-22: 30.9 mL

## 2015-02-22 MED ORDER — LACTATED RINGERS IV SOLN
INTRAVENOUS | Status: DC
  Administered 2015-02-22 (×2): via INTRAVENOUS
  Filled 2015-02-22: qty 1000

## 2015-02-22 MED ORDER — ONDANSETRON HCL 4 MG/2ML IJ SOLN
4.0000 mg | Freq: Once | INTRAMUSCULAR | Status: AC
  Administered 2015-02-22: 4 mg via INTRAVENOUS
  Filled 2015-02-22: qty 2

## 2015-02-22 MED ORDER — DEXAMETHASONE SODIUM PHOSPHATE 10 MG/ML IJ SOLN
INTRAMUSCULAR | Status: AC
  Filled 2015-02-22: qty 1

## 2015-02-22 MED ORDER — OXYCODONE HCL 5 MG PO TABS
ORAL_TABLET | ORAL | Status: AC
  Filled 2015-02-22: qty 1

## 2015-02-22 MED ORDER — ONDANSETRON HCL 4 MG/2ML IJ SOLN
INTRAMUSCULAR | Status: AC
  Filled 2015-02-22: qty 2

## 2015-02-22 MED ORDER — FENTANYL CITRATE (PF) 100 MCG/2ML IJ SOLN
25.0000 ug | INTRAMUSCULAR | Status: DC | PRN
  Administered 2015-02-22 (×3): 25 ug via INTRAVENOUS
  Administered 2015-02-22: 50 ug via INTRAVENOUS
  Filled 2015-02-22: qty 1

## 2015-02-22 MED ORDER — OXYCODONE HCL 5 MG PO CAPS: 5.0000 mg | ORAL_CAPSULE | ORAL | Status: DC | PRN

## 2015-02-22 MED ORDER — FENTANYL CITRATE (PF) 100 MCG/2ML IJ SOLN
100.0000 ug | Freq: Once | INTRAMUSCULAR | Status: AC
  Administered 2015-02-22: 100 ug via INTRAVENOUS
  Filled 2015-02-22: qty 2

## 2015-02-22 MED ORDER — GLYCOPYRROLATE 0.2 MG/ML IJ SOLN
INTRAMUSCULAR | Status: AC
  Filled 2015-02-22: qty 1

## 2015-02-22 MED ORDER — NEOSTIGMINE METHYLSULFATE 10 MG/10ML IV SOLN
INTRAVENOUS | Status: DC | PRN
Start: 2015-02-22 — End: 2015-02-22
  Administered 2015-02-22: 3 mg via INTRAVENOUS

## 2015-02-22 MED ORDER — PROPOFOL 10 MG/ML IV BOLUS
INTRAVENOUS | Status: DC | PRN
  Administered 2015-02-22: 250 mg via INTRAVENOUS
  Administered 2015-02-22: 100 mg via INTRAVENOUS

## 2015-02-22 MED ORDER — GLYCOPYRROLATE 0.2 MG/ML IJ SOLN
INTRAMUSCULAR | Status: DC | PRN
  Administered 2015-02-22: 0.4 mg via INTRAVENOUS

## 2015-02-22 MED ORDER — ONDANSETRON HCL 4 MG PO TABS: 4.0000 mg | ORAL_TABLET | Freq: Three times a day (TID) | ORAL | Status: DC | PRN

## 2015-02-22 MED ORDER — FENTANYL CITRATE (PF) 100 MCG/2ML IJ SOLN
INTRAMUSCULAR | Status: AC
  Filled 2015-02-22: qty 6

## 2015-02-22 MED ORDER — LACTATED RINGERS IV SOLN
INTRAVENOUS | Status: DC
  Filled 2015-02-22: qty 1000

## 2015-02-22 MED ORDER — SODIUM CHLORIDE 0.9 % IR SOLN
Status: DC | PRN
  Administered 2015-02-22: 27000 mL

## 2015-02-22 MED ORDER — ONDANSETRON HCL 4 MG PO TABS
4.0000 mg | ORAL_TABLET | Freq: Three times a day (TID) | ORAL | Status: DC | PRN
  Filled 2015-02-22: qty 1

## 2015-02-22 MED ORDER — ROPIVACAINE HCL 5 MG/ML IJ SOLN
Freq: Once | INTRAMUSCULAR | Status: DC
  Filled 2015-02-22 (×3): qty 30

## 2015-02-22 MED ORDER — POVIDONE-IODINE 7.5 % EX SOLN
Freq: Once | CUTANEOUS | Status: DC
  Filled 2015-02-22: qty 118

## 2015-02-22 MED ORDER — DOXYCYCLINE HYCLATE 50 MG PO CAPS: 50.0000 mg | ORAL_CAPSULE | Freq: Two times a day (BID) | ORAL | Status: DC

## 2015-02-22 MED ORDER — ROCURONIUM BROMIDE 100 MG/10ML IV SOLN
INTRAVENOUS | Status: DC | PRN
  Administered 2015-02-22: 40 mg via INTRAVENOUS
  Administered 2015-02-22: 10 mg via INTRAVENOUS

## 2015-02-22 MED ORDER — MORPHINE SULFATE (PF) 4 MG/ML IV SOLN
INTRAVENOUS | Status: DC | PRN
  Administered 2015-02-22: 4 mg via INTRAMUSCULAR

## 2015-02-22 MED ORDER — METHOCARBAMOL 500 MG PO TABS: 500.0000 mg | ORAL_TABLET | Freq: Four times a day (QID) | ORAL | Status: DC | PRN

## 2015-02-22 MED ORDER — ARTIFICIAL TEARS OP OINT
TOPICAL_OINTMENT | OPHTHALMIC | Status: AC
  Filled 2015-02-22: qty 3.5

## 2015-02-22 MED ORDER — FENTANYL CITRATE (PF) 100 MCG/2ML IJ SOLN
INTRAMUSCULAR | Status: DC | PRN
  Administered 2015-02-22: 50 ug via INTRAVENOUS
  Administered 2015-02-22: 100 ug via INTRAVENOUS
  Administered 2015-02-22 (×3): 50 ug via INTRAVENOUS

## 2015-02-22 MED ORDER — BUPIVACAINE HCL (PF) 0.25 % IJ SOLN
INTRAMUSCULAR | Status: DC | PRN
  Administered 2015-02-22: 30 mL

## 2015-02-22 MED ORDER — GLYCOPYRROLATE 0.2 MG/ML IJ SOLN
INTRAMUSCULAR | Status: AC
  Filled 2015-02-22: qty 2

## 2015-02-22 MED ORDER — STERILE WATER FOR IRRIGATION IR SOLN
Status: DC | PRN
  Administered 2015-02-22: 500 mL

## 2015-02-22 MED ORDER — MORPHINE SULFATE (PF) 4 MG/ML IV SOLN
INTRAVENOUS | Status: AC
  Filled 2015-02-22: qty 1

## 2015-02-22 MED ORDER — PROMETHAZINE HCL 25 MG/ML IJ SOLN
6.2500 mg | INTRAMUSCULAR | Status: DC | PRN
  Filled 2015-02-22: qty 1

## 2015-02-22 MED ORDER — NEOSTIGMINE METHYLSULFATE 10 MG/10ML IV SOLN
INTRAVENOUS | Status: AC
  Filled 2015-02-22: qty 1

## 2015-02-22 MED ORDER — MEPERIDINE HCL 25 MG/ML IJ SOLN
6.2500 mg | INTRAMUSCULAR | Status: DC | PRN
  Filled 2015-02-22: qty 1

## 2015-02-22 MED ORDER — EPHEDRINE SULFATE 50 MG/ML IJ SOLN
INTRAMUSCULAR | Status: DC | PRN
  Administered 2015-02-22: 15 mg via INTRAVENOUS
  Administered 2015-02-22: 10 mg via INTRAVENOUS

## 2015-02-22 MED ORDER — MIDAZOLAM HCL 5 MG/5ML IJ SOLN
INTRAMUSCULAR | Status: DC | PRN
  Administered 2015-02-22: 2 mg via INTRAVENOUS

## 2015-02-22 MED ORDER — MIDAZOLAM HCL 2 MG/2ML IJ SOLN
2.0000 mg | Freq: Once | INTRAMUSCULAR | Status: AC
  Administered 2015-02-22: 2 mg via INTRAVENOUS
  Filled 2015-02-22: qty 2

## 2015-02-22 MED ORDER — SUCCINYLCHOLINE CHLORIDE 20 MG/ML IJ SOLN
INTRAMUSCULAR | Status: DC | PRN
  Administered 2015-02-22: 100 mg via INTRAVENOUS

## 2015-02-22 MED ORDER — ACETAMINOPHEN 10 MG/ML IV SOLN
INTRAVENOUS | Status: DC | PRN
  Administered 2015-02-22: 1000 mg via INTRAVENOUS

## 2015-02-22 MED ORDER — VANCOMYCIN HCL IN DEXTROSE 1-5 GM/200ML-% IV SOLN
1000.0000 mg | INTRAVENOUS | Status: AC
  Administered 2015-02-22: 1000 mg via INTRAVENOUS
  Filled 2015-02-22: qty 200

## 2015-02-22 MED ORDER — ASPIRIN EC 325 MG PO TBEC: 325.0000 mg | DELAYED_RELEASE_TABLET | Freq: Two times a day (BID) | ORAL | Status: DC

## 2015-02-22 SURGICAL SUPPLY — 87 items
ALLOGRAFT GRFTLNK IMPLANT SYST (Anchor) IMPLANT
ANCH SUT PUSHLCK 19.5X3.5 STRL (Anchor) ×1 IMPLANT
ANCHOR PUSHLOCK PEEK 3.5X19.5 (Anchor) ×1 IMPLANT
BANDAGE ELASTIC 4 VELCRO ST LF (GAUZE/BANDAGES/DRESSINGS) ×1 IMPLANT
BANDAGE ELASTIC 6 VELCRO ST LF (GAUZE/BANDAGES/DRESSINGS) ×2 IMPLANT
BANDAGE ESMARK 6X9 LF (GAUZE/BANDAGES/DRESSINGS) ×1 IMPLANT
BLADE 4.2CUDA (BLADE) IMPLANT
BLADE CUDA 5.5 (BLADE) ×2 IMPLANT
BLADE CUDA GRT WHITE 3.5 (BLADE) IMPLANT
BLADE SURG 10 STRL SS (BLADE) ×2 IMPLANT
BLADE SURG 15 STRL LF DISP TIS (BLADE) ×1 IMPLANT
BLADE SURG 15 STRL SS (BLADE) ×2
BNDG CMPR 9X6 STRL LF SNTH (GAUZE/BANDAGES/DRESSINGS) ×1
BNDG ESMARK 6X9 LF (GAUZE/BANDAGES/DRESSINGS) ×2
BNDG GAUZE ELAST 4 BULKY (GAUZE/BANDAGES/DRESSINGS) ×3 IMPLANT
BUR OVAL 6.0 (BURR) IMPLANT
BUR VERTEX HOODED 4.5 (BURR) ×2 IMPLANT
CLOTH BEACON ORANGE TIMEOUT ST (SAFETY) ×2 IMPLANT
COVER BACK TABLE 60X90IN (DRAPES) ×2 IMPLANT
COVER LIGHT HANDLE  1/PK (MISCELLANEOUS) ×1
COVER LIGHT HANDLE 1/PK (MISCELLANEOUS) IMPLANT
DRAPE ARTHROSCOPY W/POUCH 114 (DRAPES) ×2 IMPLANT
DRAPE INCISE IOBAN 66X45 STRL (DRAPES) ×2 IMPLANT
DRAPE LG THREE QUARTER DISP (DRAPES) ×4 IMPLANT
DRAPE OEC MINIVIEW 54X84 (DRAPES) ×1 IMPLANT
DRAPE U-SHAPE 47X51 STRL (DRAPES) ×2 IMPLANT
DRILL FLIPCUTTER II 10MM (CUTTER) IMPLANT
DURAPREP 26ML APPLICATOR (WOUND CARE) ×3 IMPLANT
ELECT REM PT RETURN 9FT ADLT (ELECTROSURGICAL) ×2
ELECTRODE REM PT RTRN 9FT ADLT (ELECTROSURGICAL) ×1 IMPLANT
FIBERSTICK 2 (SUTURE) IMPLANT
FLIPCUTTER II 10MM (CUTTER) ×2
GAUZE XEROFORM 1X8 LF (GAUZE/BANDAGES/DRESSINGS) ×2 IMPLANT
GLOVE BIO SURGEON STRL SZ7.5 (GLOVE) ×5 IMPLANT
GLOVE BIO SURGEON STRL SZ8 (GLOVE) ×4 IMPLANT
GLOVE INDICATOR 8.0 STRL GRN (GLOVE) ×4 IMPLANT
GOWN STRL REUS W/ TWL LRG LVL3 (GOWN DISPOSABLE) ×1 IMPLANT
GOWN STRL REUS W/ TWL XL LVL3 (GOWN DISPOSABLE) ×2 IMPLANT
GOWN STRL REUS W/TWL LRG LVL3 (GOWN DISPOSABLE) ×2
GOWN STRL REUS W/TWL XL LVL3 (GOWN DISPOSABLE) ×4
GRAFT TISS 60-80 FRZN TENDON (Tissue) IMPLANT
IMMOBILIZER KNEE 22 (SOFTGOODS) ×1 IMPLANT
IMMOBILIZER KNEE 22 UNIV (SOFTGOODS) IMPLANT
IV NS IRRIG 3000ML ARTHROMATIC (IV SOLUTION) ×13 IMPLANT
KIT ROOM TURNOVER WOR (KITS) ×2 IMPLANT
KIT TRANSTIBIAL (DISPOSABLE) IMPLANT
KNEE WRAP E Z 3 GEL PACK (MISCELLANEOUS) ×2 IMPLANT
MANIFOLD NEPTUNE II (INSTRUMENTS) ×1 IMPLANT
MINI VAC (SURGICAL WAND) IMPLANT
NEEDLE HYPO 22GX1.5 SAFETY (NEEDLE) ×2 IMPLANT
PACK ARTHROSCOPY DSU (CUSTOM PROCEDURE TRAY) ×2 IMPLANT
PACK BASIN DAY SURGERY FS (CUSTOM PROCEDURE TRAY) ×2 IMPLANT
PAD ABD 8X10 STRL (GAUZE/BANDAGES/DRESSINGS) ×4 IMPLANT
PAD ARMBOARD 7.5X6 YLW CONV (MISCELLANEOUS) IMPLANT
PADDING CAST ABS 4INX4YD NS (CAST SUPPLIES) ×1
PADDING CAST ABS COTTON 4X4 ST (CAST SUPPLIES) ×1 IMPLANT
PENCIL BUTTON HOLSTER BLD 10FT (ELECTRODE) ×2 IMPLANT
PK GRAFTLINK ALLO IMPLANT SYST (Anchor) ×2 IMPLANT
SET ARTHROSCOPY TUBING (MISCELLANEOUS) ×2
SET ARTHROSCOPY TUBING LN (MISCELLANEOUS) ×1 IMPLANT
SET PAD KNEE POSITIONER (MISCELLANEOUS) ×2 IMPLANT
SPONGE GAUZE 4X4 12PLY (GAUZE/BANDAGES/DRESSINGS) ×4 IMPLANT
SPONGE GAUZE 4X4 12PLY STER LF (GAUZE/BANDAGES/DRESSINGS) ×1 IMPLANT
SPONGE LAP 4X18 X RAY DECT (DISPOSABLE) ×2 IMPLANT
STRIP CLOSURE SKIN 1/2X4 (GAUZE/BANDAGES/DRESSINGS) ×2 IMPLANT
SUCTION FRAZIER TIP 10 FR DISP (SUCTIONS) ×2 IMPLANT
SUT 2 FIBERLOOP 20 STRT BLUE (SUTURE) ×4
SUT ETHILON 4 0 PS 2 18 (SUTURE) ×5 IMPLANT
SUT FIBERWIRE #2 38 T-5 BLUE (SUTURE)
SUT MNCRL AB 3-0 PS2 18 (SUTURE) IMPLANT
SUT VIC AB 0 CT1 36 (SUTURE) IMPLANT
SUT VIC AB 0 CT2 27 (SUTURE) IMPLANT
SUT VIC AB 2-0 CT1 (SUTURE) ×1 IMPLANT
SUT VIC AB 2-0 CT1 27 (SUTURE) ×2
SUT VIC AB 2-0 CT1 TAPERPNT 27 (SUTURE) ×1 IMPLANT
SUTURE 2 FIBERLOOP 20 STRT BLU (SUTURE) ×2 IMPLANT
SUTURE FIBERWR #2 38 T-5 BLUE (SUTURE) IMPLANT
SUTURE TIGERSTICK 2 TIGERWIR 2 (MISCELLANEOUS) IMPLANT
SYR CONTROL 10ML LL (SYRINGE) ×1 IMPLANT
TAPE STRIPS DRAPE STRL (GAUZE/BANDAGES/DRESSINGS) ×1 IMPLANT
TIGERSTICK 2 TIGERWIRE 2 (MISCELLANEOUS)
TISSUE GRAFTLINK FGL (Tissue) ×2 IMPLANT
TOWEL OR 17X24 6PK STRL BLUE (TOWEL DISPOSABLE) ×2 IMPLANT
TUBE CONNECTING 12X1/4 (SUCTIONS) ×6 IMPLANT
WAND 30 DEG SABER W/CORD (SURGICAL WAND) IMPLANT
WAND 90 DEG TURBOVAC W/CORD (SURGICAL WAND) IMPLANT
WATER STERILE IRR 500ML POUR (IV SOLUTION) ×2 IMPLANT

## 2015-02-22 NOTE — Interval H&P Note (Signed)
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 After consideration of risks, benefits and other options for treatment, the patient has consented to  Procedure(s): LEFT KNEE ARTHROSCOPY DEBRIDEMENT PARTIAL MENISCECTOMY VERSES REPAIR POSSIBLE CHONDROPLASTY ALLOGRAFT WITH ANTERIOR CRUCIATE LIGAMENT (ACL) RECONSTRUCTION  (Left) as a surgical intervention .  The patient's history has been reviewed, patient examined, no change in status, stable for surgery.  I have reviewed the patient's chart and labs.  Questions were answered to the patient's satisfaction.     Kaitlyn Raymond ANDREW

## 2015-02-22 NOTE — H&P (View-Only) (Signed)
NPO AFTER MN WITH EXCEPTION CLEAR LIQUIDS UNTIL 0700 (NO CREAM/ MILK PRODUCTS).  ARRIVE AT 1100.  NEEDS ISTAT.  CURRENT EKG, ECHO, AND STRESS TEST TO BE FAXED FROM DR Einar Gip.  WILL TAKE COZAAR AND PROTONIX AM DOS W/ SIPS OF WATER.

## 2015-02-22 NOTE — Discharge Instructions (Signed)

## 2015-02-22 NOTE — Anesthesia Procedure Notes (Addendum)
Anesthesia Regional Block:  Adductor canal block  Pre-Anesthetic Checklist: ,, timeout performed, Correct Patient, Correct Site, Correct Laterality, Correct Procedure, Correct Position, site marked, Risks and benefits discussed,  Surgical consent,  Pre-op evaluation,  At surgeon's request and post-op pain management  Laterality: Left and Lower  Prep: chloraprep       Needles:  Injection technique: Single-shot  Needle Type: Stimiplex     Needle Length: 9cm 9 cm Needle Gauge: 21 and 21 G    Additional Needles:  Procedures: ultrasound guided (picture in chart) Adductor canal block Narrative:  Injection made incrementally with aspirations every 5 mL.  Performed by: Other  Anesthesiologist: Rod Mae  Additional Notes: Patient tolerated the procedure well without complications   Procedure Name: Intubation Date/Time: 02/22/2015 1:45 PM Performed by: Wanita Chamberlain Pre-anesthesia Checklist: Patient identified, Timeout performed, Emergency Drugs available, Suction available and Patient being monitored Patient Re-evaluated:Patient Re-evaluated prior to inductionOxygen Delivery Method: Circle system utilized Preoxygenation: Pre-oxygenation with 100% oxygen Intubation Type: IV induction Ventilation: Mask ventilation without difficulty Laryngoscope Size: Mac and 3 Grade View: Grade II Tube type: Oral Number of attempts: 1 Airway Equipment and Method: Bite block Placement Confirmation: breath sounds checked- equal and bilateral,  ETT inserted through vocal cords under direct vision and positive ETCO2 Secured at: 24 cm Tube secured with: Tape Dental Injury: Teeth and Oropharynx as per pre-operative assessment

## 2015-02-22 NOTE — Anesthesia Preprocedure Evaluation (Signed)
Anesthesia Evaluation  Patient identified by MRN, date of birth, ID band Patient awake    Reviewed: Allergy & Precautions, NPO status , Patient's Chart, lab work & pertinent test results  Airway Mallampati: II  TM Distance: >3 FB Neck ROM: Full    Dental no notable dental hx.    Pulmonary neg pulmonary ROS,    Pulmonary exam normal breath sounds clear to auscultation       Cardiovascular hypertension, Pt. on medications Normal cardiovascular exam Rhythm:Regular Rate:Normal     Neuro/Psych negative neurological ROS  negative psych ROS   GI/Hepatic negative GI ROS, Neg liver ROS,   Endo/Other  Morbid obesity  Renal/GU negative Renal ROS  negative genitourinary   Musculoskeletal negative musculoskeletal ROS (+)   Abdominal   Peds negative pediatric ROS (+)  Hematology negative hematology ROS (+)   Anesthesia Other Findings   Reproductive/Obstetrics negative OB ROS                             Anesthesia Physical Anesthesia Plan  ASA: II  Anesthesia Plan: General   Post-op Pain Management: GA combined w/ Regional for post-op pain   Induction: Intravenous  Airway Management Planned: Oral ETT  Additional Equipment:   Intra-op Plan:   Post-operative Plan: Extubation in OR  Informed Consent: I have reviewed the patients History and Physical, chart, labs and discussed the procedure including the risks, benefits and alternatives for the proposed anesthesia with the patient or authorized representative who has indicated his/her understanding and acceptance.   Dental advisory given  Plan Discussed with: CRNA  Anesthesia Plan Comments: (Adductor canal block)        Anesthesia Quick Evaluation

## 2015-02-22 NOTE — Op Note (Signed)
Dictated#[id number]

## 2015-02-22 NOTE — Anesthesia Postprocedure Evaluation (Signed)
  Anesthesia Post-op Note  Patient: Kaitlyn Raymond  Procedure(s) Performed: Procedure(s) (LRB): LEFT KNEE ARTHROSCOPY; PARTIAL Lateral MENISCECTOMY; CHONDROPLASTY ALLOGRAFT WITH ANTERIOR CRUCIATE LIGAMENT (ACL) RECONSTRUCTION  (Left)  Patient Location: PACU  Anesthesia Type: GA combined with regional for post-op pain  Level of Consciousness: awake and alert   Airway and Oxygen Therapy: Patient Spontanous Breathing  Post-op Pain: mild  Post-op Assessment: Post-op Vital signs reviewed, Patient's Cardiovascular Status Stable, Respiratory Function Stable, Patent Airway and No signs of Nausea or vomiting  Last Vitals:  Filed Vitals:   02/22/15 1545  BP: 125/79  Pulse: 84  Temp: 36.8 C  Resp: 14    Post-op Vital Signs: stable   Complications: No apparent anesthesia complications

## 2015-02-22 NOTE — H&P (Signed)
Kaitlyn Raymond is an 34 y.o. female.   Chief Complaint: Left knee instability HPI: Patient presents with joint discomfort that had been persistent for several weeks now. Despite conservative treatments, her discomfort has not improved. Imaging was obtained. Other conservative and surgical treatments were discussed in detail. Patient wishes to proceed with surgery as consented. Denies SOB, CP, or calf pain. No Fever, chills, or nausea/ vomiting. Pt has been Cleared by cardiology.    Past Medical History  Diagnosis Date  . Allergic rhinitis   . Hypertension   . GERD (gastroesophageal reflux disease)   . Heart murmur   . Left ACL tear   . Tear of MCL (medial collateral ligament) of knee   . Chondromalacia of left knee   . Fluid retention   . Wears glasses     Past Surgical History  Procedure Laterality Date  . Cesarean section  01-25-2010    w/ BILATERAL TUBAL LIGATION    Family History  Problem Relation Age of Onset  . Hypertension Mother   . Diabetes Father   . Heart disease Father   . Hypertension Brother     oldest brother   Social History:  reports that she has never smoked. She has never used smokeless tobacco. She reports that she does not drink alcohol or use illicit drugs.  Allergies:  Allergies  Allergen Reactions  . Penicillins     Medications Prior to Admission  Medication Sig Dispense Refill  . acetaminophen (TYLENOL) 500 MG tablet Take 1,000 mg by mouth every 6 (six) hours as needed.    . hydrochlorothiazide (MICROZIDE) 12.5 MG capsule Take 12.5 mg by mouth as needed.    Marland Kitchen ibuprofen (ADVIL,MOTRIN) 200 MG tablet Take 800 mg by mouth every 6 (six) hours as needed.    Marland Kitchen losartan (COZAAR) 50 MG tablet Take 50 mg by mouth every morning.    . pantoprazole (PROTONIX) 40 MG tablet Take 40 mg by mouth 2 (two) times daily.    . potassium chloride (K-DUR) 10 MEQ tablet Take 10 mEq by mouth every other day.      No results found for this or any previous visit (from  the past 48 hour(s)). No results found.  Review of Systems  Constitutional: Negative.   HENT: Negative.   Eyes: Negative.   Cardiovascular: Negative.   Gastrointestinal: Negative.   Genitourinary: Negative.   Musculoskeletal: Positive for joint pain.  Skin: Negative.   Neurological: Negative.   Psychiatric/Behavioral: Negative.     Blood pressure 98/57, pulse 71, temperature 98.7 F (37.1 C), temperature source Oral, resp. rate 16, height 6\' 1"  (1.854 m), weight 148.326 kg (327 lb), last menstrual period 02/16/2015, SpO2 100 %. Physical Exam  Constitutional: She is oriented to person, place, and time. She appears well-developed.  HENT:  Head: Normocephalic.  Eyes: EOM are normal.  Neck: Normal range of motion.  Cardiovascular: Normal rate, normal heart sounds and intact distal pulses.   Respiratory: Effort normal and breath sounds normal.  GI: Soft.  Genitourinary:  Deferred  Musculoskeletal:  Good ROM LLE. Limited strength. Left knee instability. Positive Lachmans.  Calf soft and non tender. LLE grossly n/v intact.  Neurological: She is alert and oriented to person, place, and time.  Skin: Skin is warm and dry.  Psychiatric: Her behavior is normal.     Assessment/Plan Left knee torn ACL, MCL tear, and torn mensicus: Left ACL allograft reconstruction and menisectomy as consented D/c home today with family Follow instructions F/u in office  Kaitlyn Raymond L 02/22/2015, 12:09 PM

## 2015-02-22 NOTE — Transfer of Care (Signed)
Immediate Anesthesia Transfer of Care Note  Patient: Kaitlyn Raymond  Procedure(s) Performed: Procedure(s): LEFT KNEE ARTHROSCOPY; PARTIAL Lateral MENISCECTOMY; CHONDROPLASTY ALLOGRAFT WITH ANTERIOR CRUCIATE LIGAMENT (ACL) RECONSTRUCTION  (Left)  Patient Location: PACU  Anesthesia Type:General  Level of Consciousness: awake, alert , oriented and patient cooperative  Airway & Oxygen Therapy: Patient Spontanous Breathing and Patient connected to nasal cannula oxygen  Post-op Assessment: Report given to RN and Post -op Vital signs reviewed and stable  Post vital signs: Reviewed and stable  Last Vitals:  Filed Vitals:   02/22/15 1306  BP: 123/59  Pulse: 64  Temp:   Resp: 12    Complications: No apparent anesthesia complications

## 2015-02-23 ENCOUNTER — Encounter (HOSPITAL_BASED_OUTPATIENT_CLINIC_OR_DEPARTMENT_OTHER): Payer: Self-pay | Admitting: Specialist

## 2015-02-23 NOTE — Op Note (Signed)
Kaitlyn Raymond, Kaitlyn Raymond NO.:  [id number]  MEDICAL RECORD NO.:  [id number]  LOCATION:                               FACILITY:  [facility]  PHYSICIAN:  Cynda Familia, M.D.DATE OF BIRTH:  [birth year]  DATE OF PROCEDURE:  02/22/2015 DATE OF DISCHARGE:  02/22/2015                              OPERATIVE REPORT   PREOPERATIVE DIAGNOSES:  Left knee ruptured anterior cruciate ligament and possible torn medial and/or lateral meniscus.  POSTOPERATIVE DIAGNOSIS: 1. Left knee complete rupture anterior cruciate ligament. 2. __________ grade 2 medial collateral ligament. 3. Complex tear posterior horn, lateral meniscus. 4. Grade 2 chondromalacia lateral tibial plateau and grade 2 to 3     medial femoral condyle.  PROCEDURE: 1. Left knee arthroscopic-assisted allograft anterior cruciate     ligament reconstruction. 2. Partial lateral meniscectomy. 3. Chondroplasty lateral tibial plateau and medial femoral condyle.  SURGEON:  Cynda Familia, M.D.  ASSISTANT:  Wyatt Portela, PA-C.  ANESTHESIA:  Adductor canal block, general anesthetic with intraoperative knee block.  ESTIMATED BLOOD LOSS:  Minimal.  DRAINS:  None.  COMPLICATIONS:  None.  TOURNIQUET TIME:  72 minutes at 325 mmHg.  DISPOSITION:  PACU, stable.  OPERATIVE DETAILS:  The patient was counseled in the holding area. Correct site was identified.  IV started.  __________ block was administered.  IV antibiotics were given.  She was then taken to the operating room, placed in a supine position, given general anesthesia. Due to the patient's large body habitus and marked generalized ligamentous laxity, we were very cautious with the lower extremities in all aspects.  The left knee was examined.  She had 5 degrees recurvatum on the left knee versus 15 degrees recurvatum on her physiologic normal side.  She also had full flexion 120 degrees.  Varus and valgus stress was stable with trace to 1+ with a  firm end point.  Posterolateral corner appeared to be stable.  PCL was stable.  We then meticulously prepped it with DuraPrep and draped in a sterile fashion being very careful not to overly stress the lower extremity.  Time-out was done from the left side, which was exsanguinated with an Esmarch.  Tourniquet was inflated to 325 mmHg as a large body habitus.  Arthroscopic portals were established; proximal, medial, inferomedial, inferolateral. Diagnostic arthroscopy was undertaken.  The intercondylar notch __________ complete rupture of the anterior cruciate ligament off its femoral attachment.  At this time, __________ appropriate allograft from [employer] was chosen and prepared on the back table by [employer], PA-C and they consist of the ACL TightRope system on both sides.  __________ allowed to pre-stretch.  The ACL fibers were debrided arthroscopically.  Notchplasty performed in standard fashion.  PCL was identified and protected.  Patellofemoral joint revealed normal articular __________ lateral gutters were unremarkable.  Lateral side was inspected.  Grade 2 chondromalacia lateral tibial plateau __________.  There was unstable tear of the posterior horn of the lateral meniscus.  Using basket motorized shaver, a partial lateral meniscectomy was performed back to stable base __________ healthy except for the distal aspect of femoral trochlea __________.  The medial meniscus was intact.  The femoral guide was sewn to the  overtop position.  Small incision was made just lateral and an [employer] flip cutter was put in anatomic ACL footprint and retro socket was formed. FiberWire suture passed in place and debris was removed.  Small incision made anteromedial tibia.  The tibial gutters were in excellent position and a retro socket was performed.  FiberWire sutures passed in place, the debris was removed.  At this point in time, we placed the graft into the knee in a retrograde  fashion __________ femoral socket.  We confirmed that the femoral button was on the lateral femoral cortex arthroscopically palpable and visual through a small incision.  We then pulled the graft into the socket appropriate depth and then placed the tibial side in appropriate depth.  Both sides were __________ balance. On the tibial side, it was securely fixed with the button and also PushLock anchor.  With this knee through range of motion with excellent fixation both femoral and tibial side, knee was clinically stable. Graft had excellent orientation and tension.  Arthroscopic equipments were removed.  Both incision anteromedial and distal lateral was closed with Vicryl on subcu and then the skin was closed with nylon __________ portal site 10 mL Sensorcaine was placed in the knee joint at the end of the case __________ morphine sulfate.  Sterile dressing applied to the knee.  Tourniquet deflated.  She was placed into a postop knee brace and ice pack.  She was taken from the operating room to the PACU in a stable condition and discharged home.  Sponge and needle count were correct. There were no complications.  To help the patient positioning, prepping, draping, technical surgical assistance throughout entire case, wound closure, application of dressing, and splint, and graft preparation, Mr. Wyatt Portela, PA's assistance was needed throughout the entire case.          ______________________________ Cynda Familia, M.D.     RAC/MEDQ  D:  02/22/2015  T:  02/23/2015  Job:  [id number]

## 2015-03-02 ENCOUNTER — Other Ambulatory Visit (HOSPITAL_COMMUNITY): Payer: Self-pay | Admitting: Specialist

## 2015-03-02 ENCOUNTER — Ambulatory Visit (HOSPITAL_COMMUNITY)
Admission: RE | Admit: 2015-03-02 | Discharge: 2015-03-02 | Disposition: A | Discharge status: Home / Self Care | Payer: 59 | Source: Ambulatory Visit | Attending: Cardiovascular Disease | Admitting: Cardiovascular Disease

## 2015-03-02 ENCOUNTER — Encounter (HOSPITAL_COMMUNITY): Payer: 59

## 2015-03-02 DIAGNOSIS — M79602 Pain in left arm: Secondary | ICD-10-CM

## 2015-03-02 DIAGNOSIS — I10 Essential (primary) hypertension: Secondary | ICD-10-CM | POA: Insufficient documentation

## 2015-03-02 DIAGNOSIS — M79605 Pain in left leg: Secondary | ICD-10-CM

## 2015-03-07 ENCOUNTER — Telehealth: Payer: Self-pay | Admitting: Gastroenterology

## 2015-03-07 NOTE — Telephone Encounter (Signed)
Patient takes ASA 325 mg BID as directed by her surgeon. She is on Protonix daily and Carafate ac and hs.  She is experiencing increased epigastric burning and pain. If she belches, it feels a little better for a short time. Yesterday she began experiencing some nausea. Appointment scheduled for re-evaluation. No earlier [facility] appointment, but she is on the wait list now.

## 2015-03-13 ENCOUNTER — Encounter: Payer: Self-pay | Admitting: Physician Assistant

## 2015-03-13 ENCOUNTER — Ambulatory Visit (INDEPENDENT_AMBULATORY_CARE_PROVIDER_SITE_OTHER): Payer: 59 | Admitting: Physician Assistant

## 2015-03-13 VITALS — BP 138/80 | HR 78 | Ht 73.0 in | Wt 327.0 lb

## 2015-03-13 DIAGNOSIS — K219 Gastro-esophageal reflux disease without esophagitis: Secondary | ICD-10-CM | POA: Diagnosis not present

## 2015-03-13 DIAGNOSIS — R1310 Dysphagia, unspecified: Secondary | ICD-10-CM

## 2015-03-13 DIAGNOSIS — B370 Candidal stomatitis: Secondary | ICD-10-CM

## 2015-03-13 MED ORDER — FLUCONAZOLE 100 MG PO TABS: 100.0000 mg | ORAL_TABLET | Freq: Every day | ORAL | Status: DC

## 2015-03-13 MED ORDER — SUCRALFATE 1 GM/10ML PO SUSP: 1.0000 g | Freq: Four times a day (QID) | ORAL | Status: DC

## 2015-03-13 NOTE — Progress Notes (Signed)
Reviewed and agree with documentation and assessment and plan. K. Veena Nandigam , MD   

## 2015-03-13 NOTE — Patient Instructions (Signed)
We have sent the following medications to your pharmacy for you to pick up at your convenience: Carafate and Diflucan  Continue taking Protonix 40mg  daily.  Keep EGD appointment with Dr Silverio Decamp on 04-02-2015

## 2015-03-13 NOTE — Progress Notes (Signed)
Patient ID: Kaitlyn Raymond, female   DOB: 07-24-1980, 34 y.o.   MRN: [id number]     History of Present Illness: Kaitlyn Raymond is a delightful 34 year old female with a history of refractory GERD who was last seen here on August 11. At that time she was instructed to continue twice a day Protonix and Carafate 1 g liquid before meals and at bedtime was added. H. pylori stool antigen was negative. She was scheduled for an EGD with Dr. Deatra Ina. Unfortunately, she sustained a knee injury. She did not have her EGD. She is status post a left anterior cruciate ligament and meniscal repair 2-1/2 weeks ago. Postoperatively, she was on aspirin 325 mg 2 tabs daily for DVT prophylaxis. She began to develop a burning in her esophagus with pain swallowing along with a burning in the epigastric area with nausea. She discontinued the aspirin. She feels the Carafate helped quite a bit but she is out of it. She also saw her PCP and was diagnosed with thrush and was treated with 3 days of Diflucan along with nystatin swish and swallow. Her mouth is feeling much better but she still has some white coating on the posterior tongue. She continues to have some mild discomfort when swallowing but states it is not bad as it was last week. She has not had any melena or bright red blood per rectum.   Past Medical History  Diagnosis Date  . Allergic rhinitis   . Hypertension   . GERD (gastroesophageal reflux disease)   . Heart murmur   . Left ACL tear   . Tear of MCL (medial collateral ligament) of knee   . Chondromalacia of left knee   . Fluid retention   . Wears glasses     Past Surgical History  Procedure Laterality Date  . Cesarean section  01-25-2010    w/ BILATERAL TUBAL LIGATION  . Knee arthroscopy with anterior cruciate ligament (acl) repair with hamstring graft Left 02/22/2015    Procedure: LEFT KNEE ARTHROSCOPY; PARTIAL Lateral MENISCECTOMY; CHONDROPLASTY ALLOGRAFT WITH ANTERIOR CRUCIATE LIGAMENT (ACL)  RECONSTRUCTION ;  Surgeon: Sydnee Cabal, MD;  Location: [facility];  Service: Orthopedics;  Laterality: Left;   Family History  Problem Relation Age of Onset  . Hypertension Mother   . Diabetes Father   . Heart disease Father   . Hypertension Brother     oldest brother   Social History  Substance Use Topics  . Smoking status: Never Smoker   . Smokeless tobacco: Never Used  . Alcohol Use: No   Current Outpatient Prescriptions  Medication Sig Dispense Refill  . hydrochlorothiazide (MICROZIDE) 12.5 MG capsule Take 12.5 mg by mouth as needed.    Marland Kitchen HYDROcodone-acetaminophen (NORCO/VICODIN) 5-325 MG tablet Take 1 tablet by mouth every 6 (six) hours as needed for moderate pain.    Marland Kitchen losartan (COZAAR) 50 MG tablet Take 50 mg by mouth every morning.    . methocarbamol (ROBAXIN) 500 MG tablet Take 1 tablet (500 mg total) by mouth every 6 (six) hours as needed. 50 tablet 2  . ondansetron (ZOFRAN) 4 MG tablet Take 1 tablet (4 mg total) by mouth every 8 (eight) hours as needed for nausea or vomiting. 20 tablet 0  . pantoprazole (PROTONIX) 40 MG tablet Take 40 mg by mouth 2 (two) times daily.    . potassium chloride (K-DUR) 10 MEQ tablet Take 10 mEq by mouth every other day.    . sucralfate (CARAFATE) 1 GM/10ML suspension Take 1  g by mouth 4 (four) times daily.    . fluconazole (DIFLUCAN) 100 MG tablet Take 1 tablet (100 mg total) by mouth daily. 3 tablet 0  . sucralfate (CARAFATE) 1 GM/10ML suspension Take 10 mLs (1 g total) by mouth 4 (four) times daily. 1200 mL 1   No current facility-administered medications for this visit.   Allergies  Allergen Reactions  . Penicillins      Review of Systems: Gen: Denies any fever, chills, sweats, anorexia, fatigue, weakness, malaise, weight loss, and sleep disorder CV: Denies chest pain, angina, palpitations, syncope, orthopnea, PND, peripheral edema, and claudication. Resp: Denies dyspnea at rest, dyspnea with exercise, cough,  sputum, wheezing, coughing up blood, and pleurisy. GI: Denies vomiting blood, jaundice, and fecal incontinence.  Complains of odynophagia GU : Denies urinary burning, blood in urine, urinary frequency, urinary hesitancy, nocturnal urination, and urinary incontinence. MS: Status post left anterior cruciate ligament and meniscal repair 2-1/2 weeks ago. Derm: Denies rash, itching, dry skin, hives, moles, warts, or unhealing ulcers.  Psych: Denies depression, anxiety, memory loss, suicidal ideation, hallucinations, paranoia, and confusion. Heme: Denies bruising, bleeding, and enlarged lymph nodes. Neuro:  Denies any headaches, dizziness, paresthesia Endo:  Denies any problems with DM, thyroid, adrenal    Physical Exam: BP 138/80 mmHg  Pulse 78  Ht 6\' 1"  (1.854 m)  Wt 327 lb (148.326 kg)  BMI 43.15 kg/m2 General: Pleasant, African-American female in no acute distress Head: Normocephalic and atraumatic, oropharynx moist, tongue with thick white coating on posterior aspect and a spot on left pupil mucosa Eyes:  sclerae anicteric, conjunctiva pink  Ears: Normal auditory acuity Lungs: Clear throughout to auscultation Heart: Regular rate and rhythm Abdomen: Soft, non distended, mild epigastric tenderness to palpation with no rebound or guarding.. No masses, no hepatomegaly. Normal bowel sounds Musculoskeletal: Symmetrical with no gross deformities  Extremities: No edema  Neurological: Alert oriented x 4, grossly nonfocal Psychological:  Alert and cooperative. Normal mood and affect  Assessment and Recommendations: 34 year old female with a history of refractory GERD who was recently had an exacerbation associated with some odynophagia and epigastric pain after being on aspirin after orthopedic surgery. She may have an esophagitis or gastritis. She has been instructed to continue her pantoprazole twice a day. She will continue Carafate suspension 1 g before meals and at bedtime. She has been  prescribed Diflucan 100 mg 1 tablet daily for 3 more days. She already has an EGD scheduled for December 5. Further recommendations will be made pending the findings of the above.        Jadyn Brasher, Vita Barley PA-C 03/13/2015,

## 2015-04-02 ENCOUNTER — Encounter: Payer: Self-pay | Admitting: Gastroenterology

## 2015-04-02 ENCOUNTER — Ambulatory Visit (AMBULATORY_SURGERY_CENTER): Payer: 59 | Admitting: Gastroenterology

## 2015-04-02 VITALS — BP 140/81 | HR 90 | Temp 97.8°F | Resp 28 | Ht 73.0 in | Wt 327.0 lb

## 2015-04-02 DIAGNOSIS — K219 Gastro-esophageal reflux disease without esophagitis: Secondary | ICD-10-CM | POA: Diagnosis present

## 2015-04-02 DIAGNOSIS — G8929 Other chronic pain: Secondary | ICD-10-CM

## 2015-04-02 DIAGNOSIS — R1013 Epigastric pain: Secondary | ICD-10-CM

## 2015-04-02 MED ORDER — SODIUM CHLORIDE 0.9 % IV SOLN: 500.0000 mL | INTRAVENOUS | Status: DC

## 2015-04-02 NOTE — Progress Notes (Signed)
Report to PACU, RN, vss, BBS= Clear.  

## 2015-04-02 NOTE — Patient Instructions (Signed)
YOU HAD AN ENDOSCOPIC PROCEDURE TODAY AT THE [facility]:   Refer to the procedure report that was given to you for any specific questions about what was found during the examination.  If the procedure report does not answer your questions, please call your gastroenterologist to clarify.  If you requested that your care partner not be given the details of your procedure findings, then the procedure report has been included in a sealed envelope for you to review at your convenience later.  YOU SHOULD EXPECT: Some feelings of bloating in the abdomen. Passage of more gas than usual.  Walking can help get rid of the air that was put into your GI tract during the procedure and reduce the bloating. If you had a lower endoscopy (such as a colonoscopy or flexible sigmoidoscopy) you may notice spotting of blood in your stool or on the toilet paper. If you underwent a bowel prep for your procedure, you may not have a normal bowel movement for a few days.  Please Note:  You might notice some irritation and congestion in your nose or some drainage.  This is from the oxygen used during your procedure.  There is no need for concern and it should clear up in a day or so.  SYMPTOMS TO REPORT IMMEDIATELY:    Following upper endoscopy (EGD)  Vomiting of blood or coffee ground material  New chest pain or pain under the shoulder blades  Painful or persistently difficult swallowing  New shortness of breath  Fever of 100F or higher  Black, tarry-looking stools  For urgent or emergent issues, a gastroenterologist can be reached at any hour by calling [phone].   DIET: Your first meal following the procedure should be a small meal and then it is ok to progress to your normal diet. Heavy or fried foods are harder to digest and may make you feel nauseous or bloated.  Likewise, meals heavy in dairy and vegetables can increase bloating.  Drink plenty of fluids but you should avoid alcoholic beverages  for 24 hours.  ACTIVITY:  You should plan to take it easy for the rest of today and you should NOT DRIVE or use heavy machinery until tomorrow (because of the sedation medicines used during the test).    FOLLOW UP: Our staff will call the number listed on your records the next business day following your procedure to check on you and address any questions or concerns that you may have regarding the information given to you following your procedure. If we do not reach you, we will leave a message.  However, if you are feeling well and you are not experiencing any problems, there is no need to return our call.  We will assume that you have returned to your regular daily activities without incident.  If any biopsies were taken you will be contacted by phone or by letter within the next 1-3 weeks.  Please call us at [phone] if you have not heard about the biopsies in 3 weeks.    SIGNATURES/CONFIDENTIALITY: You and/or your care partner have signed paperwork which will be entered into your electronic medical record.  These signatures attest to the fact that that the information above on your After Visit Summary has been reviewed and is understood.  Full responsibility of the confidentiality of this discharge information lies with you and/or your care-partner. 

## 2015-04-02 NOTE — Op Note (Signed)
[place]  [address] [place], [id number]   ENDOSCOPY PROCEDURE REPORT  PATIENT: Kaitlyn Raymond, Kaitlyn Raymond  MR#: [id number] BIRTHDATE: April 06, 1981 , 34  yrs. old GENDER: female ENDOSCOPIST: Harl Bowie, MD REFERRED BY:  Crist Infante, M.D. PROCEDURE DATE:  04/02/2015 PROCEDURE:  EGD, diagnostic ASA CLASS:     Class II INDICATIONS:  diagnostic procedure, epigastric pain, and heartburn.  MEDICATIONS: Propofol 300 mg IV TOPICAL ANESTHETIC: none  DESCRIPTION OF PROCEDURE: After the risks benefits and alternatives of the procedure were thoroughly explained, informed consent was obtained.  The LB [id number] endoscope was introduced through the mouth and advanced to the second portion of the duodenum , Without limitations.  The instrument was slowly withdrawn as the mucosa was fully examined.  ESOPHAGUS: The mucosa of the esophagus appeared normal.   A Schatzki ring was found in the distal esophagus and was widely open.  STOMACH: Gastric fundic polyps otherwise the mucosa of the stomach appeared normal.  DUODENUM: The duodenal mucosa showed no abnormalities in the bulb and 2nd part of the duodenum.  Retroflexed views revealed as previously described.     The scope was then withdrawn from the patient and the procedure completed.  COMPLICATIONS: There were no immediate complications.  ENDOSCOPIC IMPRESSION: 1.   The mucosa of the esophagus appeared normal 2.   Schatzki ring was found in the distal esophagus 3.   The mucosa of the stomach appeared normal 4.   The duodenal mucosa showed no abnormalities in the bulb and 2nd part of the duodenum  RECOMMENDATIONS: 1.  Continue PPI 2.  Anti-reflux regimen to be follow Can stop taking Carafate    eSigned:  Harl Bowie, MD 04/02/2015 3:03 PM

## 2015-04-03 ENCOUNTER — Telehealth: Payer: Self-pay | Admitting: *Deleted

## 2015-04-03 NOTE — Telephone Encounter (Signed)
Ok

## 2015-04-03 NOTE — Telephone Encounter (Signed)
  Follow up Call-  Call back number 04/02/2015  Post procedure Call Back phone  # [phone]  Permission to leave phone message Yes     Patient questions:  Do you have a fever, pain , or abdominal swelling? No pain  NO Pain Score  Patient states that her lip was "busted", and that her l eye was swollen with red specks around it" see note  Have you tolerated food without any problems? Yes.    Have you been able to return to your normal activities? Yes.    Do you have any questions about your discharge instructions: Diet   No. Medications  No. Follow up visit  No.  Do you have questions or concerns about your Care? Yes.    Actions: * If pain score is 4 or above:no pain; see note No action needed, pain <4.

## 2015-08-01 DIAGNOSIS — S83512D Sprain of anterior cruciate ligament of left knee, subsequent encounter: Secondary | ICD-10-CM | POA: Diagnosis not present

## 2015-08-08 DIAGNOSIS — S83512D Sprain of anterior cruciate ligament of left knee, subsequent encounter: Secondary | ICD-10-CM | POA: Diagnosis not present

## 2015-08-09 DIAGNOSIS — N390 Urinary tract infection, site not specified: Secondary | ICD-10-CM | POA: Diagnosis not present

## 2015-08-15 DIAGNOSIS — S83512D Sprain of anterior cruciate ligament of left knee, subsequent encounter: Secondary | ICD-10-CM | POA: Diagnosis not present

## 2015-08-22 DIAGNOSIS — S83512D Sprain of anterior cruciate ligament of left knee, subsequent encounter: Secondary | ICD-10-CM | POA: Diagnosis not present

## 2015-08-29 DIAGNOSIS — S83512D Sprain of anterior cruciate ligament of left knee, subsequent encounter: Secondary | ICD-10-CM | POA: Diagnosis not present

## 2015-09-05 DIAGNOSIS — S83512D Sprain of anterior cruciate ligament of left knee, subsequent encounter: Secondary | ICD-10-CM | POA: Diagnosis not present

## 2015-09-12 DIAGNOSIS — N9489 Other specified conditions associated with female genital organs and menstrual cycle: Secondary | ICD-10-CM | POA: Diagnosis not present

## 2015-09-12 DIAGNOSIS — Z1389 Encounter for screening for other disorder: Secondary | ICD-10-CM | POA: Diagnosis not present

## 2015-09-12 DIAGNOSIS — Z130 Encounter for screening for diseases of the blood and blood-forming organs and certain disorders involving the immune mechanism: Secondary | ICD-10-CM | POA: Diagnosis not present

## 2015-09-12 DIAGNOSIS — Z6841 Body Mass Index (BMI) 40.0 and over, adult: Secondary | ICD-10-CM | POA: Diagnosis not present

## 2015-09-12 DIAGNOSIS — Z01419 Encounter for gynecological examination (general) (routine) without abnormal findings: Secondary | ICD-10-CM | POA: Diagnosis not present

## 2015-09-13 DIAGNOSIS — Z4789 Encounter for other orthopedic aftercare: Secondary | ICD-10-CM | POA: Diagnosis not present

## 2015-09-18 DIAGNOSIS — G4730 Sleep apnea, unspecified: Secondary | ICD-10-CM | POA: Diagnosis not present

## 2015-09-19 DIAGNOSIS — S83512D Sprain of anterior cruciate ligament of left knee, subsequent encounter: Secondary | ICD-10-CM | POA: Diagnosis not present

## 2015-09-29 DIAGNOSIS — S83512D Sprain of anterior cruciate ligament of left knee, subsequent encounter: Secondary | ICD-10-CM | POA: Diagnosis not present

## 2015-10-10 DIAGNOSIS — Z4789 Encounter for other orthopedic aftercare: Secondary | ICD-10-CM | POA: Diagnosis not present

## 2015-10-10 DIAGNOSIS — S83512D Sprain of anterior cruciate ligament of left knee, subsequent encounter: Secondary | ICD-10-CM | POA: Diagnosis not present

## 2015-11-07 ENCOUNTER — Encounter: Payer: Self-pay | Admitting: Neurology

## 2015-11-07 ENCOUNTER — Ambulatory Visit (INDEPENDENT_AMBULATORY_CARE_PROVIDER_SITE_OTHER): Payer: BLUE CROSS/BLUE SHIELD | Admitting: Neurology

## 2015-11-07 VITALS — BP 136/88 | HR 82 | Resp 18 | Ht 73.0 in | Wt 355.0 lb

## 2015-11-07 DIAGNOSIS — M545 Low back pain: Secondary | ICD-10-CM | POA: Diagnosis not present

## 2015-11-07 DIAGNOSIS — G4710 Hypersomnia, unspecified: Secondary | ICD-10-CM | POA: Diagnosis not present

## 2015-11-07 DIAGNOSIS — R51 Headache: Secondary | ICD-10-CM

## 2015-11-07 DIAGNOSIS — G4733 Obstructive sleep apnea (adult) (pediatric): Secondary | ICD-10-CM | POA: Diagnosis not present

## 2015-11-07 DIAGNOSIS — R519 Headache, unspecified: Secondary | ICD-10-CM

## 2015-11-07 NOTE — Progress Notes (Signed)
Subjective:    Patient ID: Kaitlyn Raymond is a 34 y.o. female.  HPI     Kaitlyn Age, MD, PhD [facility] [address], [address].O. [place], [place] [address]  Dear Dr. Joylene Draft,   I saw your patient, Kaitlyn Raymond, upon your kind request in my neurologic clinic today for initial consultation of her sleep disorder, in particular, concern for underlying obstructive sleep apnea. The patient is unaccompanied today. As you know, Kaitlyn Raymond is a 35 year old right-handed woman with an underlying medical history of reflux disease, allergic rhinitis, hypertension, heart murmur, left anterior cruciate ligament tear, status post surgery and November 2016, and morbid obesity, who reports snoring and excessive daytime somnolence. I reviewed your office records from 10/05/2015, which you kindly included. Of note, the patient had a home sleep test on 09/18/2015 which indicated an RDI of 20.4, oxyhemoglobin desaturation nadir of 85%. The patient has an Epworth sleepiness score of 13 out of 24, fatigue score is 34 out of 63. She does report sleep disruption, difficulty initiating sleep at times, nocturia about once per night and occasional morning headaches about once per week. She also has a history of migraines and morning headaches are typically not migrainous. She is divorced and lives with her 2 children, ages 67 and 64. She works as a Technical brewer at [employer]. She tries to go to bed around 10 PM and sometimes it takes her 1 hour or 1 hour 15 minutes to fall asleep. Wake up time is between 6 and 6:30 AM. She does not always wake up rested. She denies restless leg symptoms. Her weight was down to 318 pounds as she was exercising 5 days a week. In September 2016 she injured her left knee and unfortunately gained weight after that. She had surgery to the left knee  in October 2016. She has a family history of obstructive sleep apnea in both parents who have CPAP  machines. She does report loud snoring and waking up with a sense of gasping for air at times. She was told in the past she has a heart murmur.  Her Past Medical History Is Significant For: Past Medical History  Diagnosis Date  . Allergic rhinitis   . Hypertension   . GERD (gastroesophageal reflux disease)   . Heart murmur   . Left ACL tear   . Tear of MCL (medial collateral ligament) of knee   . Chondromalacia of left knee   . Fluid retention   . Wears glasses   . Anemia   . Arthritis     LEFT KNEE  . Headache     Her Past Surgical History Is Significant For: Past Surgical History  Procedure Laterality Date  . Cesarean section  01-25-2010    w/ BILATERAL TUBAL LIGATION  . Knee arthroscopy with anterior cruciate ligament (acl) repair with hamstring graft Left 02/22/2015    Procedure: LEFT KNEE ARTHROSCOPY; PARTIAL Lateral MENISCECTOMY; CHONDROPLASTY ALLOGRAFT WITH ANTERIOR CRUCIATE LIGAMENT (ACL) RECONSTRUCTION ;  Surgeon: Sydnee Cabal, MD;  Location: [facility];  Service: Orthopedics;  Laterality: Left;    Her Family History Is Significant For: Family History  Problem Relation Raymond of Onset  . Hypertension Mother   . Diabetes Father   . Heart disease Father   . Congestive Heart Failure Father   . Hypertension Brother     oldest brother  . Colon cancer Maternal Grandmother     Her Social History Is Significant For: Social History  Social History  . Marital Status: Divorced    Spouse Name: N/A  . Number of Children: 2  . Years of Education: Assoc   Occupational History  . GMA    Social History Main Topics  . Smoking status: Never Smoker   . Smokeless tobacco: Never Used  . Alcohol Use: No  . Drug Use: No  . Sexual Activity: Not Asked   Other Topics Concern  . None   Social History Narrative   Drinks 1 cup of coffee 2x a week     Her Allergies Are:  Allergies  Allergen Reactions  . Penicillins   :   Her Current Medications Are:   Outpatient Encounter Prescriptions as of 11/07/2015  Medication Sig  . Bioflavonoid Products (BIOFLEX PO) Take by mouth.  . Cyanocobalamin (B-12) 2500 MCG TABS Take by mouth.  . losartan-hydrochlorothiazide (HYZAAR) 50-12.5 MG tablet TK 1 T PO QD FOR BP  . methocarbamol (ROBAXIN) 500 MG tablet Take 1 tablet (500 mg total) by mouth every 6 (six) hours as needed.  . ondansetron (ZOFRAN) 4 MG tablet Take 1 tablet (4 mg total) by mouth every 8 (eight) hours as needed for nausea or vomiting.  . pantoprazole (PROTONIX) 40 MG tablet Take 40 mg by mouth 2 (two) times daily.  . potassium chloride (K-DUR) 10 MEQ tablet Take 10 mEq by mouth every other day.  . traMADol (ULTRAM) 50 MG tablet TK 1 T PO  QHS  . [DISCONTINUED] hydrochlorothiazide (MICROZIDE) 12.5 MG capsule Take 12.5 mg by mouth as needed.  . [DISCONTINUED] HYDROcodone-acetaminophen (NORCO/VICODIN) 5-325 MG tablet Take 1 tablet by mouth every 6 (six) hours as needed for moderate pain.  . [DISCONTINUED] losartan (COZAAR) 50 MG tablet Take 50 mg by mouth every morning.  . [DISCONTINUED] nystatin (MYCOSTATIN) 100000 UNIT/ML suspension SWISH AND GARGLE WITH 5 MLS QID PRN  . [DISCONTINUED] sucralfate (CARAFATE) 1 GM/10ML suspension Take 1 g by mouth 4 (four) times daily.  . [DISCONTINUED] sucralfate (CARAFATE) 1 GM/10ML suspension Take 10 mLs (1 g total) by mouth 4 (four) times daily.   No facility-administered encounter medications on file as of 11/07/2015.  :  Review of Systems:  Out of a complete 14 point review of systems, all are reviewed and negative with the exception of these symptoms as listed below:   Review of Systems  Neurological:       Patient has trouble falling and staying asleep, snoring, wakes up gasping for air, wakes up feeling tires, morning headaches, daytime tiredness, sometimes takes a nap.  Patient took a 3 day at home sleep test.   Epworth Sleepiness Scale 0= would never doze 1= slight chance of dozing 2= moderate  chance of dozing 3= high chance of dozing  Sitting and reading:1 Watching TV:3 Sitting inactive in a public place (ex. Theater or meeting):2 As a passenger in a car for an hour without a break:2 Lying down to rest in the afternoon:3 Sitting and talking to someone:1 Sitting quietly after lunch (no alcohol):1 In a car, while stopped in traffic:0 Total:13   Objective:  Neurologic Exam  Physical Exam Physical Examination:   Filed Vitals:   11/07/15 0859  BP: 136/88  Pulse: 82  Resp: 18   General Examination: The patient is a very pleasant 35 y.o. female in no acute distress. She appears well-developed and well-nourished and very well groomed.   HEENT: Normocephalic, atraumatic, pupils are equal, round and reactive to light and accommodation. Funduscopic exam is normal with sharp disc margins  noted. Extraocular tracking is good without limitation to gaze excursion or nystagmus noted. Normal smooth pursuit is noted. Hearing is grossly intact. Face is symmetric with normal facial animation and normal facial sensation. Speech is clear with no dysarthria noted. There is no hypophonia. There is no lip, neck/head, jaw or voice tremor. Neck is supple with full range of passive and active motion. There are no carotid bruits on auscultation. Oropharynx exam reveals: mild mouth dryness, good dental hygiene and moderate airway crowding, due to smaller airway entry, larger tongue, large uvula and tonsils in place, 1-2+ in size bilaterally. Mallampati is class II. Neck circumference is 16-1/2 inches. She has a mild overbite.  Chest: Clear to auscultation without wheezing, rhonchi or crackles noted.  Heart: S1+S2+0, regular and normal without murmurs, rubs or gallops noted.   Abdomen: Soft, non-tender and non-distended with normal bowel sounds appreciated on auscultation.  Extremities: There is no pitting edema in the distal lower extremities bilaterally. Pedal pulses are intact.  Skin: Warm and  dry without trophic changes noted. There are no varicose veins.  Musculoskeletal: exam reveals no obvious joint deformities, tenderness or joint swelling or erythema, unremarkable scars from arthroscopic surgery to the left knee with good range of motion noted.   Neurologically:  Mental status: The patient is awake, alert and oriented in all 4 spheres. Her immediate and remote memory, attention, language skills and fund of knowledge are appropriate. There is no evidence of aphasia, agnosia, apraxia or anomia. Speech is clear with normal prosody and enunciation. Thought process is linear. Mood is normal and affect is normal.  Cranial nerves II - XII are as described above under HEENT exam. In addition: shoulder shrug is normal with equal shoulder height noted. Motor exam: Normal bulk, strength and tone is noted. There is no drift, tremor or rebound. Romberg is negative. Reflexes are 1+ throughout. Fine motor skills and coordination: intact with normal finger taps, normal hand movements, normal rapid alternating patting, normal foot taps and normal foot agility.  Cerebellar testing: No dysmetria or intention tremor on finger to nose testing. Heel to shin is unremarkable bilaterally. There is no truncal or gait ataxia.  Sensory exam: intact to light touch, pinprick, vibration, temperature sense in the upper and lower extremities.  Gait, station and balance: She stands easily. No veering to one side is noted. No leaning to one side is noted. Posture is Raymond-appropriate and stance is narrow based. Gait shows normal stride length and normal pace. No problems turning are noted. Tandem walk is unremarkable.   Assessment and Plan:  In summary, LU HASEK is a very pleasant 35 y.o.-year old female with an underlying medical history of reflux disease, allergic rhinitis, hypertension, heart murmur, left anterior cruciate ligament tear, status post surgery and November 2016, and morbid obesity, whose history,  FHx, recent HST and physical exam are in keeping with obstructive sleep apnea (OSA). I had a long chat with the patient about my findings and the diagnosis of OSA, its prognosis and treatment options. We talked about medical treatments, surgical interventions and non-pharmacological approaches. I explained in particular the risks and ramifications of untreated moderate to severe OSA, especially with respect to developing cardiovascular disease down the Road, including congestive heart failure, difficult to treat hypertension, cardiac arrhythmias, or stroke. Even type 2 diabetes has, in part, been linked to untreated OSA. Symptoms of untreated OSA include daytime sleepiness, memory problems, mood irritability and mood disorder such as depression and anxiety, lack of energy, as well  as recurrent headaches, especially morning headaches. We talked about trying to maintain a healthy lifestyle in general, as well as the importance of weight control. I encouraged the patient to eat healthy, exercise daily and keep well hydrated, to keep a scheduled bedtime and wake time routine, to not skip any meals and eat healthy snacks in between meals. I advised the patient not to drive when feeling sleepy. I recommended the following at this time: sleep study with potential positive airway pressure titration. (We will score hypopneas at 3% and split the sleep study into diagnostic and treatment portion, if the estimated. 2 hour AHI is >15/h).   I explained the sleep test procedure to the patient and also outlined possible surgical and non-surgical treatment options of OSA, including the use of a custom-made dental device (which would require a referral to a specialist dentist or oral surgeon), upper airway surgical options, such as pillar implants, radiofrequency surgery, tongue base surgery, and UPPP (which would involve a referral to an ENT surgeon). Rarely, jaw surgery such as mandibular advancement may be considered.  I also  explained the CPAP treatment option to the patient, who indicated that she would be willing to try CPAP if the need arises. I explained the importance of being compliant with PAP treatment, not only for insurance purposes but primarily to improve Her symptoms, and for the patient's long term health benefit, including to reduce Her cardiovascular risks. I answered all her questions today and the patient was in agreement. I would like to see her back after the sleep study is completed and encouraged her to call with any interim questions, concerns, problems or updates.   Thank you very much for allowing me to participate in the care of this nice patient. If I can be of any further assistance to you please do not hesitate to call me at [phone].  Sincerely,   Kaitlyn Age, MD, PhD

## 2015-11-07 NOTE — Patient Instructions (Signed)

## 2015-11-21 DIAGNOSIS — Z4789 Encounter for other orthopedic aftercare: Secondary | ICD-10-CM | POA: Diagnosis not present

## 2015-11-21 DIAGNOSIS — S83401D Sprain of unspecified collateral ligament of right knee, subsequent encounter: Secondary | ICD-10-CM | POA: Diagnosis not present

## 2015-11-21 DIAGNOSIS — S83512D Sprain of anterior cruciate ligament of left knee, subsequent encounter: Secondary | ICD-10-CM | POA: Diagnosis not present

## 2015-11-21 DIAGNOSIS — M25561 Pain in right knee: Secondary | ICD-10-CM | POA: Diagnosis not present

## 2015-11-26 DIAGNOSIS — I10 Essential (primary) hypertension: Secondary | ICD-10-CM | POA: Diagnosis not present

## 2015-11-26 DIAGNOSIS — H9201 Otalgia, right ear: Secondary | ICD-10-CM | POA: Diagnosis not present

## 2015-12-04 IMAGING — DX DG KNEE COMPLETE 4+V*L*
4 series · 4 of 4 positions shown · non-contrast
Comparison: 10/29/2003

CLINICAL DATA: Left knee injury during [place]. Left knee pain.
Initial encounter.

EXAM:
LEFT KNEE - COMPLETE 4+ VIEW

[knee ap]
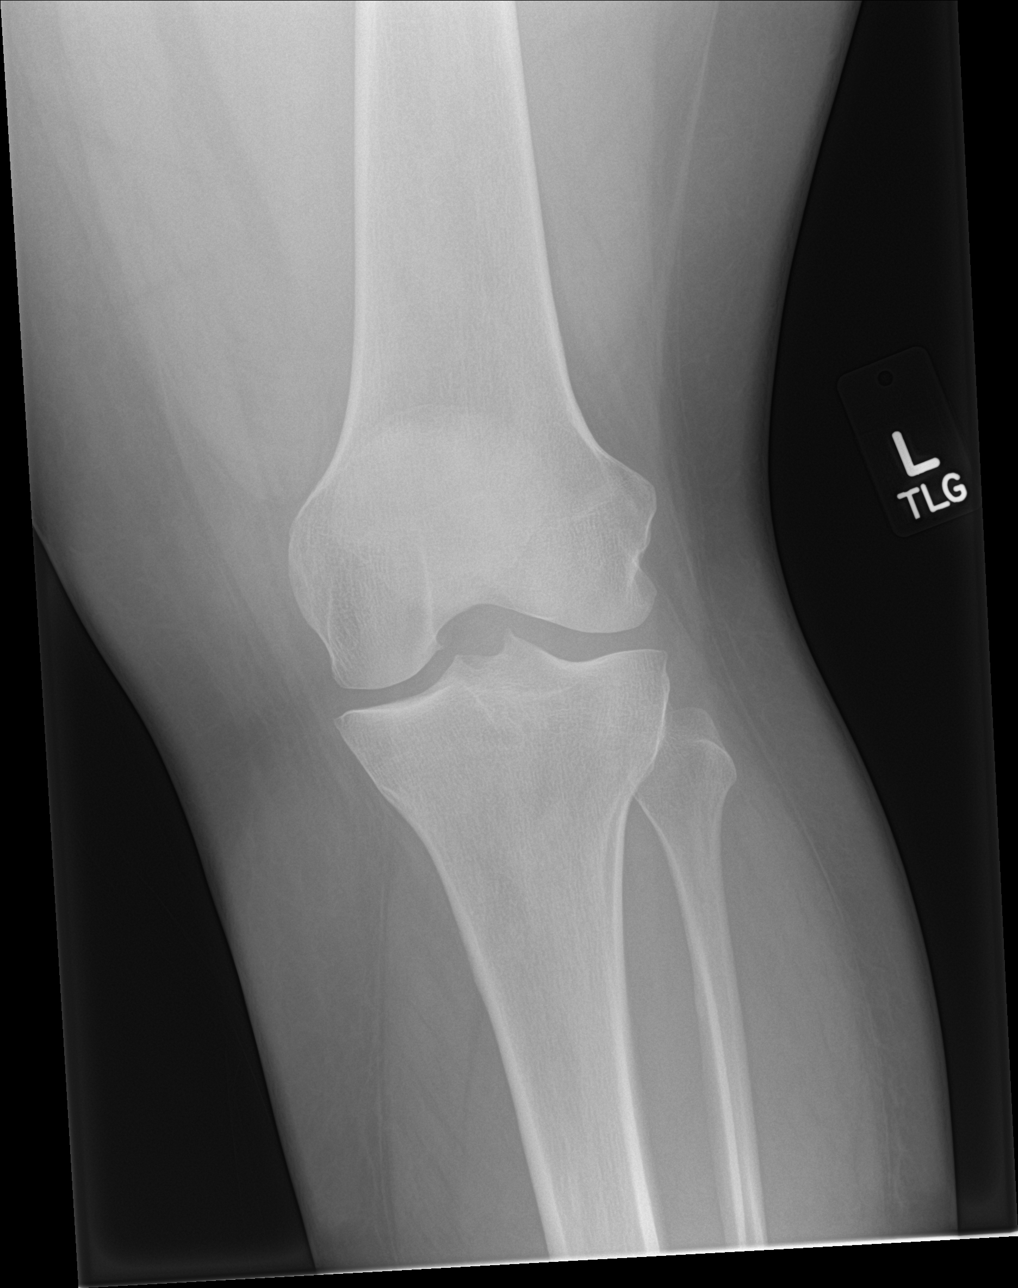

[knee lat]
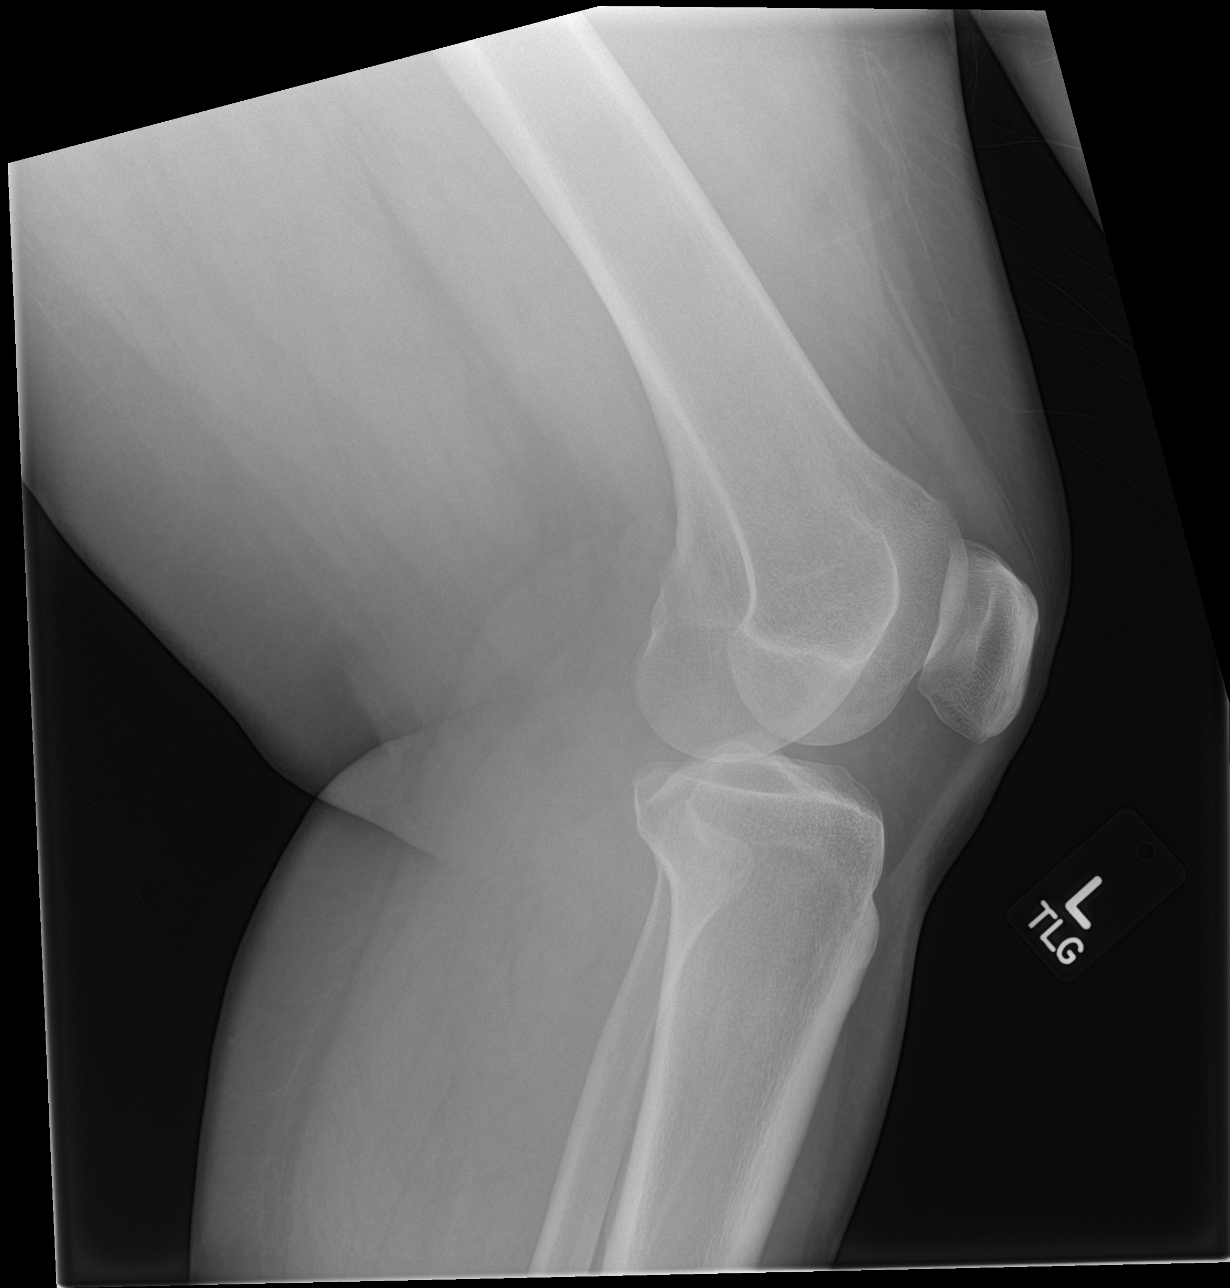

[knee obl (1 of 2)]
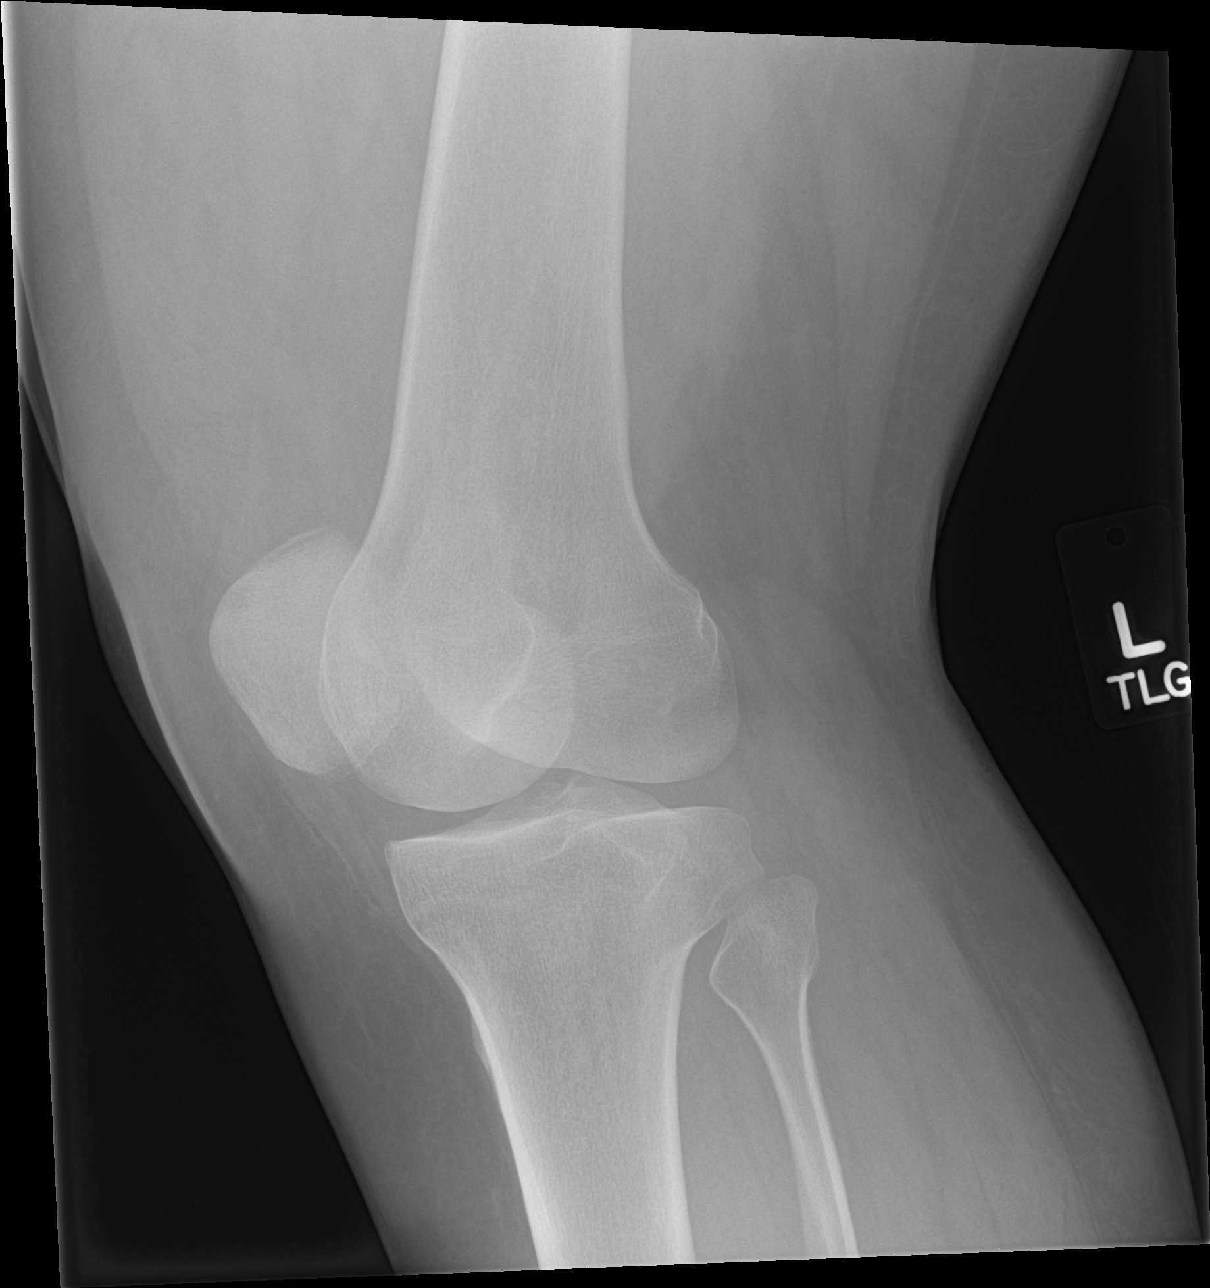

[knee obl (2 of 2)]
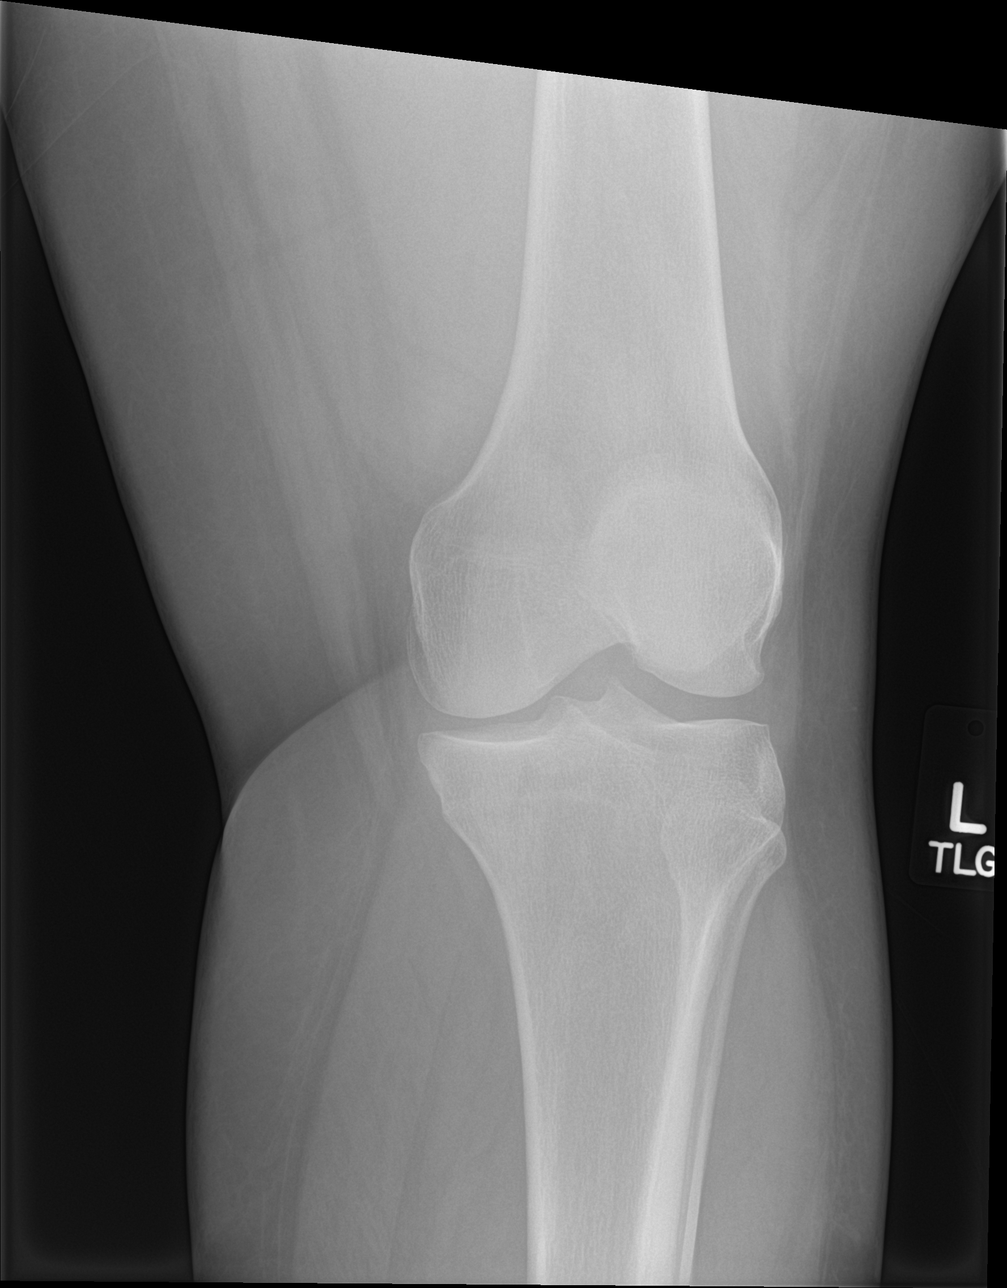

[4 of 4 positions shown; findings below may reference images not displayed]

FINDINGS: There is no evidence of fracture, dislocation, or joint effusion.
There is no evidence of arthropathy or other focal bone abnormality.
Soft tissues are unremarkable.
IMPRESSION: Negative.

## 2015-12-11 ENCOUNTER — Ambulatory Visit (INDEPENDENT_AMBULATORY_CARE_PROVIDER_SITE_OTHER): Payer: BLUE CROSS/BLUE SHIELD | Admitting: Neurology

## 2015-12-11 DIAGNOSIS — G4733 Obstructive sleep apnea (adult) (pediatric): Secondary | ICD-10-CM

## 2015-12-11 DIAGNOSIS — G479 Sleep disorder, unspecified: Secondary | ICD-10-CM

## 2015-12-11 DIAGNOSIS — G4720 Circadian rhythm sleep disorder, unspecified type: Secondary | ICD-10-CM

## 2015-12-12 DIAGNOSIS — H9209 Otalgia, unspecified ear: Secondary | ICD-10-CM | POA: Diagnosis not present

## 2015-12-18 ENCOUNTER — Telehealth: Payer: Self-pay | Admitting: Neurology

## 2015-12-18 NOTE — Telephone Encounter (Signed)
Patient referred by Dr. Joylene Draft, seen by me on 11/07/15, diagnostic PSG on 12/11/15.   Please call and notify the patient that the recent sleep study showed mild to moderate obstructive sleep apnea. Please inform patient that I would like to go over the details of the study and Rx options during a follow up appointment. Arrange a followup appointment. Also, route or fax report to PCP and referring MD, if other than PCP.  Once you have spoken to patient, you can close this encounter.   Thanks,  Star Age, MD, PhD [facility])

## 2015-12-19 NOTE — Telephone Encounter (Signed)
Per DPR., left results on vm and asked her to call back and make f/u appt with Dr. Rexene Alberts to discuss further. Report sent to PCP.

## 2016-01-09 DIAGNOSIS — S83512D Sprain of anterior cruciate ligament of left knee, subsequent encounter: Secondary | ICD-10-CM | POA: Diagnosis not present

## 2016-01-09 DIAGNOSIS — Z4789 Encounter for other orthopedic aftercare: Secondary | ICD-10-CM | POA: Diagnosis not present

## 2016-01-11 DIAGNOSIS — Z23 Encounter for immunization: Secondary | ICD-10-CM | POA: Diagnosis not present

## 2016-01-30 DIAGNOSIS — H9209 Otalgia, unspecified ear: Secondary | ICD-10-CM | POA: Diagnosis not present

## 2016-01-30 DIAGNOSIS — H9313 Tinnitus, bilateral: Secondary | ICD-10-CM | POA: Diagnosis not present

## 2016-01-30 DIAGNOSIS — H903 Sensorineural hearing loss, bilateral: Secondary | ICD-10-CM | POA: Diagnosis not present

## 2016-04-07 DIAGNOSIS — Z0000 Encounter for general adult medical examination without abnormal findings: Secondary | ICD-10-CM | POA: Diagnosis not present

## 2016-04-15 DIAGNOSIS — Z0000 Encounter for general adult medical examination without abnormal findings: Secondary | ICD-10-CM | POA: Diagnosis not present

## 2016-04-15 DIAGNOSIS — G4733 Obstructive sleep apnea (adult) (pediatric): Secondary | ICD-10-CM | POA: Diagnosis not present

## 2016-04-15 DIAGNOSIS — R300 Dysuria: Secondary | ICD-10-CM | POA: Diagnosis not present

## 2016-04-15 DIAGNOSIS — R600 Localized edema: Secondary | ICD-10-CM | POA: Diagnosis not present

## 2016-04-15 DIAGNOSIS — Z6841 Body Mass Index (BMI) 40.0 and over, adult: Secondary | ICD-10-CM | POA: Diagnosis not present

## 2016-04-15 DIAGNOSIS — Z1389 Encounter for screening for other disorder: Secondary | ICD-10-CM | POA: Diagnosis not present

## 2016-04-15 DIAGNOSIS — M25562 Pain in left knee: Secondary | ICD-10-CM | POA: Diagnosis not present

## 2016-06-04 DIAGNOSIS — R0602 Shortness of breath: Secondary | ICD-10-CM | POA: Diagnosis not present

## 2016-06-04 DIAGNOSIS — E559 Vitamin D deficiency, unspecified: Secondary | ICD-10-CM | POA: Diagnosis not present

## 2016-06-04 DIAGNOSIS — R509 Fever, unspecified: Secondary | ICD-10-CM | POA: Diagnosis not present

## 2016-06-04 DIAGNOSIS — R079 Chest pain, unspecified: Secondary | ICD-10-CM | POA: Diagnosis not present

## 2016-06-04 DIAGNOSIS — D649 Anemia, unspecified: Secondary | ICD-10-CM | POA: Diagnosis not present

## 2016-06-04 DIAGNOSIS — B351 Tinea unguium: Secondary | ICD-10-CM | POA: Diagnosis not present

## 2017-01-01 DIAGNOSIS — Z23 Encounter for immunization: Secondary | ICD-10-CM | POA: Diagnosis not present

## 2017-03-10 DIAGNOSIS — H1013 Acute atopic conjunctivitis, bilateral: Secondary | ICD-10-CM | POA: Diagnosis not present

## 2017-04-09 DIAGNOSIS — S93491A Sprain of other ligament of right ankle, initial encounter: Secondary | ICD-10-CM | POA: Diagnosis not present

## 2017-04-10 DIAGNOSIS — M25571 Pain in right ankle and joints of right foot: Secondary | ICD-10-CM | POA: Diagnosis not present

## 2017-05-12 DIAGNOSIS — S99811D Other specified injuries of right ankle, subsequent encounter: Secondary | ICD-10-CM | POA: Diagnosis not present

## 2017-05-20 DIAGNOSIS — R82998 Other abnormal findings in urine: Secondary | ICD-10-CM | POA: Diagnosis not present

## 2017-05-21 DIAGNOSIS — E559 Vitamin D deficiency, unspecified: Secondary | ICD-10-CM | POA: Diagnosis not present

## 2017-05-21 DIAGNOSIS — I10 Essential (primary) hypertension: Secondary | ICD-10-CM | POA: Diagnosis not present

## 2017-05-21 DIAGNOSIS — Z0000 Encounter for general adult medical examination without abnormal findings: Secondary | ICD-10-CM | POA: Diagnosis not present

## 2017-05-27 DIAGNOSIS — Z1389 Encounter for screening for other disorder: Secondary | ICD-10-CM | POA: Diagnosis not present

## 2017-05-27 DIAGNOSIS — R600 Localized edema: Secondary | ICD-10-CM | POA: Diagnosis not present

## 2017-05-27 DIAGNOSIS — Z0000 Encounter for general adult medical examination without abnormal findings: Secondary | ICD-10-CM | POA: Diagnosis not present

## 2017-05-27 DIAGNOSIS — E559 Vitamin D deficiency, unspecified: Secondary | ICD-10-CM | POA: Diagnosis not present

## 2017-05-27 DIAGNOSIS — Z23 Encounter for immunization: Secondary | ICD-10-CM | POA: Diagnosis not present

## 2017-06-04 DIAGNOSIS — S99811D Other specified injuries of right ankle, subsequent encounter: Secondary | ICD-10-CM | POA: Diagnosis not present

## 2017-06-04 DIAGNOSIS — M25871 Other specified joint disorders, right ankle and foot: Secondary | ICD-10-CM | POA: Diagnosis not present

## 2017-06-04 DIAGNOSIS — M67861 Other specified disorders of synovium, right knee: Secondary | ICD-10-CM | POA: Diagnosis not present

## 2017-06-26 DIAGNOSIS — H903 Sensorineural hearing loss, bilateral: Secondary | ICD-10-CM | POA: Diagnosis not present

## 2017-06-26 DIAGNOSIS — H9313 Tinnitus, bilateral: Secondary | ICD-10-CM | POA: Diagnosis not present

## 2017-10-01 DIAGNOSIS — G43909 Migraine, unspecified, not intractable, without status migrainosus: Secondary | ICD-10-CM | POA: Diagnosis not present

## 2017-10-01 DIAGNOSIS — R300 Dysuria: Secondary | ICD-10-CM | POA: Diagnosis not present

## 2017-12-29 DIAGNOSIS — G43909 Migraine, unspecified, not intractable, without status migrainosus: Secondary | ICD-10-CM | POA: Diagnosis not present

## 2018-03-30 DIAGNOSIS — Z23 Encounter for immunization: Secondary | ICD-10-CM | POA: Diagnosis not present

## 2018-04-22 DIAGNOSIS — B351 Tinea unguium: Secondary | ICD-10-CM | POA: Diagnosis not present

## 2018-04-22 DIAGNOSIS — I10 Essential (primary) hypertension: Secondary | ICD-10-CM | POA: Diagnosis not present

## 2018-04-22 DIAGNOSIS — F4321 Adjustment disorder with depressed mood: Secondary | ICD-10-CM | POA: Diagnosis not present

## 2018-04-22 DIAGNOSIS — E559 Vitamin D deficiency, unspecified: Secondary | ICD-10-CM | POA: Diagnosis not present

## 2018-07-01 DIAGNOSIS — E559 Vitamin D deficiency, unspecified: Secondary | ICD-10-CM | POA: Diagnosis not present

## 2018-07-01 DIAGNOSIS — I10 Essential (primary) hypertension: Secondary | ICD-10-CM | POA: Diagnosis not present

## 2018-07-06 DIAGNOSIS — I10 Essential (primary) hypertension: Secondary | ICD-10-CM | POA: Diagnosis not present

## 2018-07-06 DIAGNOSIS — R82998 Other abnormal findings in urine: Secondary | ICD-10-CM | POA: Diagnosis not present

## 2018-07-07 DIAGNOSIS — Z0000 Encounter for general adult medical examination without abnormal findings: Secondary | ICD-10-CM | POA: Diagnosis not present

## 2018-07-07 DIAGNOSIS — H9193 Unspecified hearing loss, bilateral: Secondary | ICD-10-CM | POA: Diagnosis not present

## 2018-07-07 DIAGNOSIS — Z1329 Encounter for screening for other suspected endocrine disorder: Secondary | ICD-10-CM | POA: Diagnosis not present

## 2018-07-07 DIAGNOSIS — E611 Iron deficiency: Secondary | ICD-10-CM | POA: Diagnosis not present

## 2018-07-07 DIAGNOSIS — I10 Essential (primary) hypertension: Secondary | ICD-10-CM | POA: Diagnosis not present

## 2018-07-07 DIAGNOSIS — B373 Candidiasis of vulva and vagina: Secondary | ICD-10-CM | POA: Diagnosis not present

## 2018-07-12 ENCOUNTER — Other Ambulatory Visit: Payer: Self-pay | Admitting: Internal Medicine

## 2018-07-12 DIAGNOSIS — R1031 Right lower quadrant pain: Secondary | ICD-10-CM

## 2018-07-15 DIAGNOSIS — K8020 Calculus of gallbladder without cholecystitis without obstruction: Secondary | ICD-10-CM | POA: Diagnosis not present

## 2018-08-06 DIAGNOSIS — I10 Essential (primary) hypertension: Secondary | ICD-10-CM | POA: Diagnosis not present

## 2018-08-06 DIAGNOSIS — E669 Obesity, unspecified: Secondary | ICD-10-CM | POA: Diagnosis not present

## 2018-08-06 DIAGNOSIS — R002 Palpitations: Secondary | ICD-10-CM | POA: Diagnosis not present

## 2018-08-06 DIAGNOSIS — R609 Edema, unspecified: Secondary | ICD-10-CM | POA: Diagnosis not present

## 2018-08-06 DIAGNOSIS — R011 Cardiac murmur, unspecified: Secondary | ICD-10-CM | POA: Diagnosis not present

## 2018-08-13 DIAGNOSIS — R011 Cardiac murmur, unspecified: Secondary | ICD-10-CM | POA: Diagnosis not present

## 2018-08-27 DIAGNOSIS — R002 Palpitations: Secondary | ICD-10-CM | POA: Diagnosis not present

## 2018-09-03 DIAGNOSIS — R002 Palpitations: Secondary | ICD-10-CM | POA: Diagnosis not present

## 2018-10-04 DIAGNOSIS — Z1151 Encounter for screening for human papillomavirus (HPV): Secondary | ICD-10-CM | POA: Diagnosis not present

## 2018-10-04 DIAGNOSIS — F419 Anxiety disorder, unspecified: Secondary | ICD-10-CM | POA: Diagnosis not present

## 2018-10-04 DIAGNOSIS — N939 Abnormal uterine and vaginal bleeding, unspecified: Secondary | ICD-10-CM | POA: Diagnosis not present

## 2018-10-04 DIAGNOSIS — Z114 Encounter for screening for human immunodeficiency virus [HIV]: Secondary | ICD-10-CM | POA: Diagnosis not present

## 2018-10-04 DIAGNOSIS — N841 Polyp of cervix uteri: Secondary | ICD-10-CM | POA: Diagnosis not present

## 2018-10-04 DIAGNOSIS — Z113 Encounter for screening for infections with a predominantly sexual mode of transmission: Secondary | ICD-10-CM | POA: Diagnosis not present

## 2018-10-04 DIAGNOSIS — Z01419 Encounter for gynecological examination (general) (routine) without abnormal findings: Secondary | ICD-10-CM | POA: Diagnosis not present

## 2018-10-04 DIAGNOSIS — I10 Essential (primary) hypertension: Secondary | ICD-10-CM | POA: Diagnosis not present

## 2018-10-04 DIAGNOSIS — Z6841 Body Mass Index (BMI) 40.0 and over, adult: Secondary | ICD-10-CM | POA: Diagnosis not present

## 2018-10-04 DIAGNOSIS — E669 Obesity, unspecified: Secondary | ICD-10-CM | POA: Diagnosis not present

## 2018-10-04 DIAGNOSIS — R87610 Atypical squamous cells of undetermined significance on cytologic smear of cervix (ASC-US): Secondary | ICD-10-CM | POA: Diagnosis not present

## 2018-10-04 DIAGNOSIS — R002 Palpitations: Secondary | ICD-10-CM | POA: Diagnosis not present

## 2018-11-26 DIAGNOSIS — R8290 Unspecified abnormal findings in urine: Secondary | ICD-10-CM | POA: Diagnosis not present

## 2018-12-31 DIAGNOSIS — S93492A Sprain of other ligament of left ankle, initial encounter: Secondary | ICD-10-CM | POA: Diagnosis not present

## 2019-03-04 DIAGNOSIS — I10 Essential (primary) hypertension: Secondary | ICD-10-CM | POA: Diagnosis not present

## 2019-03-04 DIAGNOSIS — E611 Iron deficiency: Secondary | ICD-10-CM | POA: Diagnosis not present

## 2019-04-15 ENCOUNTER — Other Ambulatory Visit: Payer: Self-pay

## 2019-04-15 DIAGNOSIS — Z20828 Contact with and (suspected) exposure to other viral communicable diseases: Secondary | ICD-10-CM | POA: Diagnosis not present

## 2019-04-15 DIAGNOSIS — Z20822 Contact with and (suspected) exposure to covid-19: Secondary | ICD-10-CM

## 2019-04-16 LAB — NOVEL CORONAVIRUS, NAA: SARS-CoV-2, NAA: NOT DETECTED

## 2019-12-14 ENCOUNTER — Telehealth: Payer: Self-pay | Admitting: Physician Assistant

## 2019-12-14 NOTE — Telephone Encounter (Signed)
Called to discuss with patient about Covid symptoms and the use of bamlanivimab/etesevimab or casirivimab/imdevimab, a monoclonal antibody infusion for those with mild to moderate Covid symptoms and at a high risk of hospitalization.  Pt is qualified for this infusion at the [facility] infusion center due to; Specific high risk criteria : BMI > 25   Message left to call back our hotline [phone].  Also sent mychart message.  Angelena Form PA-C  [facility]

## 2019-12-15 ENCOUNTER — Telehealth: Payer: Self-pay | Admitting: Physician Assistant

## 2019-12-15 NOTE — Telephone Encounter (Addendum)
Called to discuss with Lendon Collar about Covid symptoms and the use of casirivimab/imdevimab, a monoclonal antibody infusion for those with mild to moderate Covid symptoms and at a high risk of hospitalization.    Pt is qualified for this infusion at the monoclonal antibody infusion center due to co-morbid conditions and/or a member of an at-risk group, however would like to think more about the infusion at this time. Symptoms tier reviewed as well as criteria for ending isolation.  Symptoms reviewed that would warrant ED/Hospital evaluation. Preventative practices reviewed. Patient verbalized understanding. Patient advised to call back if he decides that he does want to get infusion. Callback number to the infusion center given. Patient advised to go to Urgent care or ED with severe symptoms. Last date pt would be eligible for infusion is 8/22.    She would like to check with her insurance company to see how much it might cost. Of note, pt is fully vaccinated with [employer]. Added to breakthrough cases.   Patient Active Problem List   Diagnosis Date Noted  . S/P ACL reconstruction 02/22/2015  . Headache(784.0) 07/19/2013  . Numbness 07/19/2013  . Hearing loss 07/19/2013    Angelena Form PA-C

## 2021-06-14 DIAGNOSIS — I10 Essential (primary) hypertension: Secondary | ICD-10-CM | POA: Diagnosis not present

## 2021-06-14 DIAGNOSIS — E559 Vitamin D deficiency, unspecified: Secondary | ICD-10-CM | POA: Diagnosis not present

## 2021-06-14 DIAGNOSIS — R7301 Impaired fasting glucose: Secondary | ICD-10-CM | POA: Diagnosis not present

## 2021-07-17 ENCOUNTER — Telehealth: Payer: Self-pay | Admitting: Oncology

## 2021-07-17 NOTE — Telephone Encounter (Signed)
Scheduled appt per 3/21 referral. Pt is aware of appt date and time. Pt is aware to arrive 15 mins prior to appt time and to bring and updated insurance card. Pt is aware of appt location.   ?

## 2021-07-24 ENCOUNTER — Other Ambulatory Visit: Payer: Self-pay | Admitting: *Deleted

## 2021-07-24 ENCOUNTER — Inpatient Hospital Stay: Payer: BC Managed Care – PPO | Attending: Oncology | Admitting: Oncology

## 2021-07-24 ENCOUNTER — Other Ambulatory Visit: Payer: Self-pay

## 2021-07-24 ENCOUNTER — Inpatient Hospital Stay: Payer: BC Managed Care – PPO

## 2021-07-24 VITALS — BP 149/90 | HR 84 | Temp 97.8°F | Resp 18 | Ht 73.0 in | Wt >= 6400 oz

## 2021-07-24 DIAGNOSIS — D649 Anemia, unspecified: Secondary | ICD-10-CM | POA: Diagnosis not present

## 2021-07-24 DIAGNOSIS — R894 Abnormal immunological findings in specimens from other organs, systems and tissues: Secondary | ICD-10-CM | POA: Diagnosis not present

## 2021-07-24 DIAGNOSIS — D472 Monoclonal gammopathy: Secondary | ICD-10-CM | POA: Diagnosis not present

## 2021-07-24 DIAGNOSIS — D892 Hypergammaglobulinemia, unspecified: Secondary | ICD-10-CM | POA: Diagnosis not present

## 2021-07-24 DIAGNOSIS — E611 Iron deficiency: Secondary | ICD-10-CM | POA: Insufficient documentation

## 2021-07-24 LAB — CMP (CANCER CENTER ONLY)
ALT: 14 U/L (ref 0–44)
AST: 14 U/L — ABNORMAL LOW (ref 15–41)
Albumin: 4 g/dL (ref 3.5–5.0)
Alkaline Phosphatase: 83 U/L (ref 38–126)
Anion gap: 8 (ref 5–15)
BUN: 15 mg/dL (ref 6–20)
CO2: 28 mmol/L (ref 22–32)
Calcium: 9.4 mg/dL (ref 8.9–10.3)
Chloride: 102 mmol/L (ref 98–111)
Creatinine: 0.72 mg/dL (ref 0.44–1.00)
GFR, Estimated: >60 mL/min (ref >60)
Glucose, Bld: 88 mg/dL (ref 70–99)
Potassium: 3.2 mmol/L — ABNORMAL LOW (ref 3.5–5.1)
Sodium: 138 mmol/L (ref 135–145)
Total Bilirubin: 0.6 mg/dL (ref 0.3–1.2)
Total Protein: 7.2 g/dL (ref 6.5–8.1)

## 2021-07-24 LAB — FERRITIN: Ferritin: 74 ng/mL (ref 11–307)

## 2021-07-24 LAB — CBC WITH DIFFERENTIAL (CANCER CENTER ONLY)
Abs Immature Granulocytes: 0.03 10*3/uL (ref 0.00–0.07)
Basophils Absolute: 0.1 10*3/uL (ref 0.0–0.1)
Basophils Relative: 1 %
Eosinophils Absolute: 0.2 10*3/uL (ref 0.0–0.5)
Eosinophils Relative: 2 %
HCT: 37.1 % (ref 36.0–46.0)
Hemoglobin: 12 g/dL (ref 12.0–15.0)
Immature Granulocytes: 0 %
Lymphocytes Relative: 29 %
Lymphs Abs: 2.8 10*3/uL (ref 0.7–4.0)
MCH: 26.7 pg (ref 26.0–34.0)
MCHC: 32.3 g/dL (ref 30.0–36.0)
MCV: 82.4 fL (ref 80.0–100.0)
Monocytes Absolute: 0.6 10*3/uL (ref 0.1–1.0)
Monocytes Relative: 6 %
Neutro Abs: 6.1 10*3/uL (ref 1.7–7.7)
Neutrophils Relative %: 62 %
Platelet Count: 410 10*3/uL — ABNORMAL HIGH (ref 150–400)
RBC: 4.5 MIL/uL (ref 3.87–5.11)
RDW: 13.2 % (ref 11.5–15.5)
WBC Count: 9.7 10*3/uL (ref 4.0–10.5)
nRBC: 0 % (ref 0.0–0.2)

## 2021-07-24 LAB — IRON AND IRON BINDING CAPACITY (CC-WL,HP ONLY)
Iron: 67 ug/dL (ref 28–170)
Saturation Ratios: 17 % (ref 10.4–31.8)
TIBC: 402 ug/dL (ref 250–450)
UIBC: 335 ug/dL (ref 148–442)

## 2021-07-24 LAB — SEDIMENTATION RATE: Sed Rate: 49 mm/hr — ABNORMAL HIGH (ref 0–22)

## 2021-07-24 LAB — C-REACTIVE PROTEIN: CRP: 3.3 mg/dL — ABNORMAL HIGH (ref <1.0)

## 2021-07-24 NOTE — Progress Notes (Signed)
?Reason for the request:    Abnormal serum light chains, anemia ? ?HPI: I was asked by Dr. Joylene Draft to evaluate Kaitlyn Raymond for the evaluation of abnormal labs including elevated light chain ratios.  She is a 41 year old woman with history of hypertension and iron deficiency anemia with hemoglobin out of range between 10 and 12 for an extended period of time.  She was evaluated at [facility] for an elevated kappa to lambda ratio in September 2022.  At that time she was evaluated by rheumatology for low-grade fever and elevated sedimentation rate and found to have an elevated kappa to lambda ratio of 1.93.  Serum protein electrophoresis obtained at that time showed no M spike.  Free kappa light chain was up to 24.07 and lambda was 12.48.  The ratio was 1.93.  Her CRP was 82.9 with elevated sedimentation rate of 86.  Her hemoglobin was 12.3 with a white cell count of 7.7 and a platelet count of 334.  Comprehensive metabolic panel obtained in November 2022 showed normal electrolytes total protein and albumin.  It was recommended that she follow-up in 6 months for repeat evaluation and her insurance changed and wanted to establish care locally. ? ?Clinically, she reports no complaints at this time.  She denies any joint swelling or irritation.  She denies any bone pain or pathological fractures.  She denies any recent hospitalizations or illnesses.  Continues to be active and attends activities of daily living. ? ? She does not report any headaches, blurry vision, syncope or seizures. Does not report any fevers, chills or sweats.  Does not report any cough, wheezing or hemoptysis.  Does not report any chest pain, palpitation, orthopnea or leg edema.  Does not report any nausea, vomiting or abdominal pain.  Does not report any constipation or diarrhea.  Does not report any skeletal complaints.    Does not report frequency, urgency or hematuria.  Does not report any skin rashes or lesions. Does not report  any heat or cold intolerance.  Does not report any lymphadenopathy or petechiae.  Does not report any anxiety or depression.  Remaining review of systems is negative.  ? ? ? ?Past Medical History:  ?Diagnosis Date  ? Allergic rhinitis   ? Anemia   ? Arthritis   ? LEFT KNEE  ? Chondromalacia of left knee   ? Fluid retention   ? GERD (gastroesophageal reflux disease)   ? Headache   ? Heart murmur   ? Hypertension   ? Left ACL tear   ? Tear of MCL (medial collateral ligament) of knee   ? Wears glasses   ?: ? ? ?Past Surgical History:  ?Procedure Laterality Date  ? CESAREAN SECTION  01-25-2010  ? w/ BILATERAL TUBAL LIGATION  ? KNEE ARTHROSCOPY WITH ANTERIOR CRUCIATE LIGAMENT (ACL) REPAIR WITH HAMSTRING GRAFT Left 02/22/2015  ? Procedure: LEFT KNEE ARTHROSCOPY; PARTIAL Lateral MENISCECTOMY; CHONDROPLASTY ALLOGRAFT WITH ANTERIOR CRUCIATE LIGAMENT (ACL) RECONSTRUCTION ;  Surgeon: Sydnee Cabal, MD;  Location: [facility];  Service: Orthopedics;  Laterality: Left;  ?: ? ? ?Current Outpatient Medications:  ?  losartan-hydrochlorothiazide (HYZAAR) 50-12.5 MG tablet, TK 1 T PO QD FOR BP, Disp: , Rfl: 11 ?  ondansetron (ZOFRAN) 4 MG tablet, Take 1 tablet (4 mg total) by mouth every 8 (eight) hours as needed for nausea or vomiting., Disp: 20 tablet, Rfl: 0: ? ? ?Allergies  ?Allergen Reactions  ? Penicillins   ?: ? ? ?Family History  ?Problem Relation  Age of Onset  ? Hypertension Mother   ? Diabetes Father   ? Heart disease Father   ? Congestive Heart Failure Father   ? Hypertension Brother   ?     oldest brother  ? Colon cancer Maternal Grandmother   ?: ? ? ?Social History  ? ?Socioeconomic History  ? Marital status: Divorced  ?  Spouse name: Not on file  ? Number of children: 2  ? Years of education: Assoc  ? Highest education level: Not on file  ?Occupational History  ? Occupation: GMA  ?Tobacco Use  ? Smoking status: Never  ? Smokeless tobacco: Never  ?Substance and Sexual Activity  ? Alcohol use: No  ?   Alcohol/week: 0.0 standard drinks  ? Drug use: No  ? Sexual activity: Not on file  ?Other Topics Concern  ? Not on file  ?Social History Narrative  ? Drinks 1 cup of coffee 2x a week   ? ?Social Determinants of Health  ? ?Financial Resource Strain: Not on file  ?Food Insecurity: Not on file  ?Transportation Needs: Not on file  ?Physical Activity: Not on file  ?Stress: Not on file  ?Social Connections: Not on file  ?Intimate Partner Violence: Not on file  ?: ? ?Pertinent items are noted in HPI. ? ?Exam: ?Blood pressure (!) 149/90, pulse 84, temperature 97.8 ?F (36.6 ?C), temperature source Temporal, resp. rate 18, height 6' 1"  (1.854 m), weight (!) 417 lb 1.6 oz (189.2 kg), SpO2 100 %. ?ECOG 0 ?General appearance: alert and cooperative appeared without distress. ?Head: atraumatic without any abnormalities. ?Eyes: conjunctivae/corneas clear. PERRL.  Sclera anicteric. ?Throat: lips, mucosa, and tongue normal; without oral thrush or ulcers. ?Resp: clear to auscultation bilaterally without rhonchi, wheezes or dullness to percussion. ?Cardio: regular rate and rhythm, S1, S2 normal, no murmur, click, rub or gallop ?GI: soft, non-tender; bowel sounds normal; no masses,  no organomegaly ?Skin: Skin color, texture, turgor normal. No rashes or lesions ?Lymph nodes: Cervical, supraclavicular, and axillary nodes normal. ?Neurologic: Grossly normal without any motor, sensory or deep tendon reflexes. ?Musculoskeletal: No joint deformity or effusion. ? ? ? ?Assessment and Plan:  ? ? ?41 year old with: ? ?1.  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  This was detected in September 2022 with normal CBC and electrolytes. ? ?The natural course of this disease was reviewed at this time and treatment options were discussed.  I do not see any evidence to suggest multiple myeloma, amyloidosis and less likely we are dealing with MGUS.  This likely appears to be reactive  in nature and I recommended monitoring rather than any active treatment.  Bone marrow biopsy will be deferred at this time. ? ?I recommended continued active surveillance and we will update her protein studies today and repeat in 6 months. ? ?2.  Anemia: mild and likely related to iron deficiency and chronic menstrual blood losses.  We will update iron studies today ? ?3.  Follow-up: Will be determined pending her work-up.  Tentatively will be scheduled in 6 months for repeat protein studies. ? ? ?45  minutes were dedicated to this visit. The time was spent on reviewing laboratory data, discussing treatment options, discussing differential diagnosis and answering questions regarding future plan. ? ? ? ? A copy of this consult has been forwarded to the requesting physician. ? ?

## 2021-07-25 LAB — KAPPA/LAMBDA LIGHT CHAINS
Kappa free light chain: 22.4 mg/L — ABNORMAL HIGH (ref 3.3–19.4)
Kappa, lambda light chain ratio: 2.52 — ABNORMAL HIGH (ref 0.26–1.65)
Lambda free light chains: 8.9 mg/L (ref 5.7–26.3)

## 2021-07-26 LAB — MULTIPLE MYELOMA PANEL, SERUM
Albumin SerPl Elph-Mcnc: 3.3 g/dL (ref 2.9–4.4)
Albumin/Glob SerPl: 1.1 (ref 0.7–1.7)
Alpha 1: 0.3 g/dL (ref 0.0–0.4)
Alpha2 Glob SerPl Elph-Mcnc: 0.8 g/dL (ref 0.4–1.0)
B-Globulin SerPl Elph-Mcnc: 1.2 g/dL (ref 0.7–1.3)
Gamma Glob SerPl Elph-Mcnc: 0.9 g/dL (ref 0.4–1.8)
Globulin, Total: 3.2 g/dL (ref 2.2–3.9)
IgA: 169 mg/dL (ref 87–352)
IgG (Immunoglobin G), Serum: 937 mg/dL (ref 586–1602)
IgM (Immunoglobulin M), Srm: 105 mg/dL (ref 26–217)
Total Protein ELP: 6.5 g/dL (ref 6.0–8.5)

## 2021-08-19 DIAGNOSIS — I10 Essential (primary) hypertension: Secondary | ICD-10-CM | POA: Diagnosis not present

## 2021-08-29 DIAGNOSIS — Z23 Encounter for immunization: Secondary | ICD-10-CM | POA: Diagnosis not present

## 2021-11-05 ENCOUNTER — Telehealth: Payer: Self-pay | Admitting: Oncology

## 2021-11-05 NOTE — Telephone Encounter (Signed)
Called patient regarding upcoming September appointments, left a voicemail. 

## 2022-01-23 ENCOUNTER — Other Ambulatory Visit: Payer: Self-pay

## 2022-01-23 ENCOUNTER — Inpatient Hospital Stay (HOSPITAL_BASED_OUTPATIENT_CLINIC_OR_DEPARTMENT_OTHER): Payer: BC Managed Care – PPO | Admitting: Oncology

## 2022-01-23 ENCOUNTER — Inpatient Hospital Stay: Payer: BC Managed Care – PPO | Attending: Oncology

## 2022-01-23 ENCOUNTER — Telehealth: Payer: Self-pay | Admitting: Oncology

## 2022-01-23 VITALS — BP 144/82 | HR 73 | Temp 97.9°F | Resp 19 | Ht 73.0 in | Wt 394.1 lb

## 2022-01-23 DIAGNOSIS — D472 Monoclonal gammopathy: Secondary | ICD-10-CM

## 2022-01-23 DIAGNOSIS — R779 Abnormality of plasma protein, unspecified: Secondary | ICD-10-CM | POA: Diagnosis not present

## 2022-01-23 DIAGNOSIS — D500 Iron deficiency anemia secondary to blood loss (chronic): Secondary | ICD-10-CM | POA: Insufficient documentation

## 2022-01-23 LAB — CBC WITH DIFFERENTIAL (CANCER CENTER ONLY)
Abs Immature Granulocytes: 0.02 10*3/uL (ref 0.00–0.07)
Basophils Absolute: 0.1 10*3/uL (ref 0.0–0.1)
Basophils Relative: 1 %
Eosinophils Absolute: 0.1 10*3/uL (ref 0.0–0.5)
Eosinophils Relative: 2 %
HCT: 36.5 % (ref 36.0–46.0)
Hemoglobin: 12 g/dL (ref 12.0–15.0)
Immature Granulocytes: 0 %
Lymphocytes Relative: 28 %
Lymphs Abs: 2.4 10*3/uL (ref 0.7–4.0)
MCH: 27.6 pg (ref 26.0–34.0)
MCHC: 32.9 g/dL (ref 30.0–36.0)
MCV: 84.1 fL (ref 80.0–100.0)
Monocytes Absolute: 0.5 10*3/uL (ref 0.1–1.0)
Monocytes Relative: 6 %
Neutro Abs: 5.5 10*3/uL (ref 1.7–7.7)
Neutrophils Relative %: 63 %
Platelet Count: 396 10*3/uL (ref 150–400)
RBC: 4.34 MIL/uL (ref 3.87–5.11)
RDW: 13.2 % (ref 11.5–15.5)
WBC Count: 8.5 10*3/uL (ref 4.0–10.5)
nRBC: 0 % (ref 0.0–0.2)

## 2022-01-23 LAB — CMP (CANCER CENTER ONLY)
ALT: 15 U/L (ref 0–44)
AST: 14 U/L — ABNORMAL LOW (ref 15–41)
Albumin: 4 g/dL (ref 3.5–5.0)
Alkaline Phosphatase: 86 U/L (ref 38–126)
Anion gap: 8 (ref 5–15)
BUN: 15 mg/dL (ref 6–20)
CO2: 30 mmol/L (ref 22–32)
Calcium: 9 mg/dL (ref 8.9–10.3)
Chloride: 100 mmol/L (ref 98–111)
Creatinine: 0.79 mg/dL (ref 0.44–1.00)
GFR, Estimated: >60 mL/min (ref >60)
Glucose, Bld: 92 mg/dL (ref 70–99)
Potassium: 3.4 mmol/L — ABNORMAL LOW (ref 3.5–5.1)
Sodium: 138 mmol/L (ref 135–145)
Total Bilirubin: 0.7 mg/dL (ref 0.3–1.2)
Total Protein: 7.4 g/dL (ref 6.5–8.1)

## 2022-01-23 LAB — IRON AND IRON BINDING CAPACITY (CC-WL,HP ONLY)
Iron: 58 ug/dL (ref 28–170)
Saturation Ratios: 15 % (ref 10.4–31.8)
TIBC: 377 ug/dL (ref 250–450)
UIBC: 319 ug/dL (ref 148–442)

## 2022-01-23 LAB — FERRITIN: Ferritin: 58 ng/mL (ref 11–307)

## 2022-01-23 NOTE — Telephone Encounter (Signed)
Spoke with patient confirming 10/01 appointment

## 2022-01-23 NOTE — Progress Notes (Signed)
Hematology and Oncology Follow Up Visit  Kaitlyn Raymond [id number] 11/15/1980 41 y.o. 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  She presented with kappa to lambda ratio of 2.52 without any evidence of endorgan damage.     Current therapy: Active surveillance.  Interim History: Ms. Kaitlyn Raymond returns today for a follow-up.  Since the last visit, she reports feeling well without any major complaints.  She denies any nausea vomiting or abdominal pain.  She denies any hematochezia or melena.  She does have heavy menstrual cycles at times.  She denies any bone pain or pathological fractures.    Medications: I have reviewed the patient's current medications.  Current Outpatient Medications  Medication Sig Dispense Refill   ALPRAZolam (XANAX) 0.25 MG tablet alprazolam 0.25 mg tablet (Patient not taking: Reported on 07/24/2021)     ascorbic acid (VITAMIN C) 500 MG tablet Take by mouth.     buPROPion (WELLBUTRIN XL) 150 MG 24 hr tablet Take 1 tablet by mouth every morning.     cetirizine (ZYRTEC) 10 MG tablet 1 tablet     Ferrous Sulfate (IRON) 325 (65 Fe) MG TABS Take 1 tablet by mouth daily.     FLUoxetine (PROZAC) 20 MG capsule 1 capsule     ibuprofen (ADVIL) 800 MG tablet Take by mouth. (Patient not taking: Reported on 07/24/2021)     losartan-hydrochlorothiazide (HYZAAR) 100-25 MG tablet Take 1 tablet by mouth daily.     ondansetron (ZOFRAN) 4 MG tablet Take 1 tablet (4 mg total) by mouth every 8 (eight) hours as needed for nausea or vomiting. (Patient not taking: Reported on 07/24/2021) 20 tablet 0   pantoprazole (PROTONIX) 40 MG tablet 1 tablet     potassium chloride (KLOR-CON) 10 MEQ tablet Take 10 mEq by mouth every other day.     valACYclovir (VALTREX) 1000 MG tablet 2 pills every 12 hours x1 day as needed (Patient not taking: Reported on 07/24/2021)     vitamin B-12  (CYANOCOBALAMIN) 100 MCG tablet 1 tablet     Vitamin D, Ergocalciferol, (DRISDOL) 1.25 MG (50000 UNIT) CAPS capsule Take 50,000 Units by mouth once a week.     No current facility-administered medications for this visit.     Allergies:  Allergies  Allergen Reactions   Penicillins       Physical Exam: Blood pressure (!) 144/82, pulse 73, temperature 97.9 F (36.6 C), temperature source Temporal, resp. rate 19, height 6' 1" (1.854 m), weight (!) 394 lb 1.6 oz (178.8 kg), SpO2 100 %.  ECOG: 0   General appearance: Comfortable appearing without any discomfort Head: Normocephalic without any trauma Oropharynx: Mucous membranes are moist and pink without any thrush or ulcers. Eyes: Pupils are equal and round reactive to light. Lymph nodes: No cervical, supraclavicular, inguinal or axillary lymphadenopathy.   Heart:regular rate and rhythm.  S1 and S2 without leg edema. Lung: Clear without any rhonchi or wheezes.  No dullness to percussion. Abdomin: Soft, nontender, nondistended with good bowel sounds.  No hepatosplenomegaly. Musculoskeletal: No joint deformity or effusion.  Full range of motion noted. Neurological: No deficits noted on motor, sensory and deep tendon reflex exam. Skin: No petechial rash or dryness.  Appeared moist.      Lab Results: Lab Results  Component Value Date   WBC 9.7 07/24/2021   HGB 12.0 07/24/2021   HCT 37.1 07/24/2021   MCV 82.4 07/24/2021     PLT 410 (H) 07/24/2021     Chemistry      Component Value Date/Time   NA 138 07/24/2021 1152   K 3.2 (L) 07/24/2021 1152   CL 102 07/24/2021 1152   CO2 28 07/24/2021 1152   BUN 15 07/24/2021 1152   CREATININE 0.72 07/24/2021 1152      Component Value Date/Time   CALCIUM 9.4 07/24/2021 1152   ALKPHOS 83 07/24/2021 1152   AST 14 (L) 07/24/2021 1152   ALT 14 07/24/2021 1152   BILITOT 0.6 07/24/2021 1152       Latest Reference Range & Units 07/24/21 11:52  Kappa free light chain 3.3 - 19.4 mg/L  22.4 (H)  Lambda free light chains 5.7 - 26.3 mg/L 8.9  Kappa, lambda light chain ratio 0.26 - 1.65  2.52 (H)  (H): Data is abnormally high    Impression and Plan:   41 year old with:  1.  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  She was found to have level of 22.4 with increased kappa to lambda ratio.  She has no evidence to suggest endorgan damage or multiple myeloma.  She has normal hemoglobin and kidney function.  Her work-up completed in March 2023 was personally reviewed and discussed today with the patient.  Differential diagnosis was also discussed.  She has no evidence to suggest symptomatic multiple myeloma with normal kidney function, calcium and total protein and albumin.  Serum protein electrophoresis was normal.  This does not appear to be a sign of a plasma cell disorder but certainly MGUS could be a possibility.  Inflammatory changes that is resulting in elevated CRP as well as sedimentation rate is more likely explanation rather than a plasma cell disorder.  At this time, I have recommended continued monitoring and we will update her labs today and repeated in 12 months.  Bone marrow biopsy and bone imaging may be required if her light chains do arise in the future.  2.  Iron deficiency anemia: Related to chronic menstrual cycles.  Laboratory testing from March 2023 showed normal iron stores and normalization of her hemoglobin.  She is currently on oral iron supplements.  3.  Follow-up: Will be in 12 months for repeat testing.  30  minutes were spent on this encounter.  The time was dedicated to reviewing laboratory data, reviewing the differential diagnosis as well as answering questions regarding future plan of care discussion.   Zola Button, MD 9/28/202310:25 AM

## 2022-01-24 LAB — KAPPA/LAMBDA LIGHT CHAINS
Kappa free light chain: 25.3 mg/L — ABNORMAL HIGH (ref 3.3–19.4)
Kappa, lambda light chain ratio: 2.2 — ABNORMAL HIGH (ref 0.26–1.65)
Lambda free light chains: 11.5 mg/L (ref 5.7–26.3)

## 2022-01-28 LAB — MULTIPLE MYELOMA PANEL, SERUM
Albumin SerPl Elph-Mcnc: 3.3 g/dL (ref 2.9–4.4)
Albumin/Glob SerPl: 1.1 (ref 0.7–1.7)
Alpha 1: 0.2 g/dL (ref 0.0–0.4)
Alpha2 Glob SerPl Elph-Mcnc: 0.8 g/dL (ref 0.4–1.0)
B-Globulin SerPl Elph-Mcnc: 1.2 g/dL (ref 0.7–1.3)
Gamma Glob SerPl Elph-Mcnc: 0.9 g/dL (ref 0.4–1.8)
Globulin, Total: 3.1 g/dL (ref 2.2–3.9)
IgA: 176 mg/dL (ref 87–352)
IgG (Immunoglobin G), Serum: 907 mg/dL (ref 586–1602)
IgM (Immunoglobulin M), Srm: 100 mg/dL (ref 26–217)
Total Protein ELP: 6.4 g/dL (ref 6.0–8.5)

## 2022-02-17 DIAGNOSIS — R899 Unspecified abnormal finding in specimens from other organs, systems and tissues: Secondary | ICD-10-CM | POA: Diagnosis not present

## 2022-02-17 DIAGNOSIS — I10 Essential (primary) hypertension: Secondary | ICD-10-CM | POA: Diagnosis not present

## 2022-02-17 DIAGNOSIS — Z0000 Encounter for general adult medical examination without abnormal findings: Secondary | ICD-10-CM | POA: Diagnosis not present

## 2022-02-17 DIAGNOSIS — R7301 Impaired fasting glucose: Secondary | ICD-10-CM | POA: Diagnosis not present

## 2022-02-17 DIAGNOSIS — E559 Vitamin D deficiency, unspecified: Secondary | ICD-10-CM | POA: Diagnosis not present

## 2022-04-02 ENCOUNTER — Telehealth: Payer: Self-pay | Admitting: Oncology

## 2022-04-02 NOTE — Telephone Encounter (Signed)
Called patient per dr. Alen Blew transition, left voicemail for patient to call us to explain.

## 2022-05-09 ENCOUNTER — Telehealth: Payer: Self-pay | Admitting: Oncology

## 2022-05-09 NOTE — Telephone Encounter (Signed)
Called patient to discuss Dr. Alen Blew transition. Left voicemail for patient to call us back.

## 2022-07-07 DIAGNOSIS — M1711 Unilateral primary osteoarthritis, right knee: Secondary | ICD-10-CM | POA: Diagnosis not present

## 2022-07-07 DIAGNOSIS — M25561 Pain in right knee: Secondary | ICD-10-CM | POA: Diagnosis not present

## 2022-10-29 DIAGNOSIS — Z124 Encounter for screening for malignant neoplasm of cervix: Secondary | ICD-10-CM | POA: Diagnosis not present

## 2022-10-29 DIAGNOSIS — N841 Polyp of cervix uteri: Secondary | ICD-10-CM | POA: Diagnosis not present

## 2022-10-29 DIAGNOSIS — Z01411 Encounter for gynecological examination (general) (routine) with abnormal findings: Secondary | ICD-10-CM | POA: Diagnosis not present

## 2022-10-29 DIAGNOSIS — Z113 Encounter for screening for infections with a predominantly sexual mode of transmission: Secondary | ICD-10-CM | POA: Diagnosis not present

## 2022-10-31 DIAGNOSIS — Z1231 Encounter for screening mammogram for malignant neoplasm of breast: Secondary | ICD-10-CM | POA: Diagnosis not present

## 2022-11-14 ENCOUNTER — Other Ambulatory Visit: Payer: Self-pay | Admitting: Obstetrics and Gynecology

## 2022-11-14 DIAGNOSIS — R928 Other abnormal and inconclusive findings on diagnostic imaging of breast: Secondary | ICD-10-CM

## 2022-11-19 DIAGNOSIS — N920 Excessive and frequent menstruation with regular cycle: Secondary | ICD-10-CM | POA: Diagnosis not present

## 2022-11-19 DIAGNOSIS — N939 Abnormal uterine and vaginal bleeding, unspecified: Secondary | ICD-10-CM | POA: Diagnosis not present

## 2022-12-11 ENCOUNTER — Ambulatory Visit
Admission: RE | Admit: 2022-12-11 | Discharge: 2022-12-11 | Disposition: A | Discharge status: Home / Self Care | Payer: BC Managed Care – PPO | Source: Ambulatory Visit | Attending: Obstetrics and Gynecology | Admitting: Obstetrics and Gynecology

## 2022-12-11 DIAGNOSIS — R928 Other abnormal and inconclusive findings on diagnostic imaging of breast: Secondary | ICD-10-CM | POA: Diagnosis not present

## 2022-12-11 DIAGNOSIS — N6311 Unspecified lump in the right breast, upper outer quadrant: Secondary | ICD-10-CM | POA: Diagnosis not present

## 2023-01-26 ENCOUNTER — Telehealth: Payer: Self-pay | Admitting: Physician Assistant

## 2023-01-26 NOTE — Telephone Encounter (Signed)
Patient is aware of rescheduled appointment times/dates 

## 2023-01-27 ENCOUNTER — Ambulatory Visit: Payer: BC Managed Care – PPO | Admitting: Oncology

## 2023-01-27 ENCOUNTER — Other Ambulatory Visit: Payer: BC Managed Care – PPO

## 2023-01-27 ENCOUNTER — Inpatient Hospital Stay: Payer: BC Managed Care – PPO

## 2023-01-27 ENCOUNTER — Ambulatory Visit: Payer: BC Managed Care – PPO | Admitting: Physician Assistant

## 2023-01-27 ENCOUNTER — Inpatient Hospital Stay: Payer: BC Managed Care – PPO | Admitting: Physician Assistant

## 2023-01-30 ENCOUNTER — Other Ambulatory Visit: Payer: Self-pay

## 2023-01-30 DIAGNOSIS — D472 Monoclonal gammopathy: Secondary | ICD-10-CM

## 2023-02-02 ENCOUNTER — Other Ambulatory Visit: Payer: Self-pay

## 2023-02-02 ENCOUNTER — Inpatient Hospital Stay: Payer: BC Managed Care – PPO | Attending: Physician Assistant

## 2023-02-02 DIAGNOSIS — G8929 Other chronic pain: Secondary | ICD-10-CM | POA: Insufficient documentation

## 2023-02-02 DIAGNOSIS — R609 Edema, unspecified: Secondary | ICD-10-CM | POA: Diagnosis not present

## 2023-02-02 DIAGNOSIS — M545 Low back pain, unspecified: Secondary | ICD-10-CM | POA: Diagnosis not present

## 2023-02-02 DIAGNOSIS — D472 Monoclonal gammopathy: Secondary | ICD-10-CM

## 2023-02-02 DIAGNOSIS — R768 Other specified abnormal immunological findings in serum: Secondary | ICD-10-CM | POA: Insufficient documentation

## 2023-02-02 DIAGNOSIS — Z800 Family history of malignant neoplasm of digestive organs: Secondary | ICD-10-CM | POA: Diagnosis not present

## 2023-02-02 DIAGNOSIS — M546 Pain in thoracic spine: Secondary | ICD-10-CM | POA: Diagnosis not present

## 2023-02-02 DIAGNOSIS — D72829 Elevated white blood cell count, unspecified: Secondary | ICD-10-CM | POA: Insufficient documentation

## 2023-02-02 DIAGNOSIS — D75839 Thrombocytosis, unspecified: Secondary | ICD-10-CM | POA: Diagnosis not present

## 2023-02-02 DIAGNOSIS — R700 Elevated erythrocyte sedimentation rate: Secondary | ICD-10-CM | POA: Insufficient documentation

## 2023-02-02 DIAGNOSIS — R0602 Shortness of breath: Secondary | ICD-10-CM | POA: Insufficient documentation

## 2023-02-02 LAB — CBC WITH DIFFERENTIAL (CANCER CENTER ONLY)
Abs Immature Granulocytes: 0.02 10*3/uL (ref 0.00–0.07)
Basophils Absolute: 0.1 10*3/uL (ref 0.0–0.1)
Basophils Relative: 1 %
Eosinophils Absolute: 0.1 10*3/uL (ref 0.0–0.5)
Eosinophils Relative: 1 %
HCT: 40.6 % (ref 36.0–46.0)
Hemoglobin: 13 g/dL (ref 12.0–15.0)
Immature Granulocytes: 0 %
Lymphocytes Relative: 30 %
Lymphs Abs: 3.3 10*3/uL (ref 0.7–4.0)
MCH: 27 pg (ref 26.0–34.0)
MCHC: 32 g/dL (ref 30.0–36.0)
MCV: 84.4 fL (ref 80.0–100.0)
Monocytes Absolute: 0.6 10*3/uL (ref 0.1–1.0)
Monocytes Relative: 6 %
Neutro Abs: 6.8 10*3/uL (ref 1.7–7.7)
Neutrophils Relative %: 62 %
Platelet Count: 462 10*3/uL — ABNORMAL HIGH (ref 150–400)
RBC: 4.81 MIL/uL (ref 3.87–5.11)
RDW: 13 % (ref 11.5–15.5)
WBC Count: 10.9 10*3/uL — ABNORMAL HIGH (ref 4.0–10.5)
nRBC: 0 % (ref 0.0–0.2)

## 2023-02-02 LAB — CMP (CANCER CENTER ONLY)
ALT: 18 U/L (ref 0–44)
AST: 15 U/L (ref 15–41)
Albumin: 4.1 g/dL (ref 3.5–5.0)
Alkaline Phosphatase: 89 U/L (ref 38–126)
Anion gap: 9 (ref 5–15)
BUN: 15 mg/dL (ref 6–20)
CO2: 25 mmol/L (ref 22–32)
Calcium: 9.7 mg/dL (ref 8.9–10.3)
Chloride: 103 mmol/L (ref 98–111)
Creatinine: 0.82 mg/dL (ref 0.44–1.00)
GFR, Estimated: >60 mL/min (ref >60)
Glucose, Bld: 102 mg/dL — ABNORMAL HIGH (ref 70–99)
Potassium: 3.7 mmol/L (ref 3.5–5.1)
Sodium: 137 mmol/L (ref 135–145)
Total Bilirubin: 0.5 mg/dL (ref 0.3–1.2)
Total Protein: 7.6 g/dL (ref 6.5–8.1)

## 2023-02-02 LAB — FERRITIN: Ferritin: 59 ng/mL (ref 11–307)

## 2023-02-02 LAB — IRON AND IRON BINDING CAPACITY (CC-WL,HP ONLY)
Iron: 52 ug/dL (ref 28–170)
Saturation Ratios: 13 % (ref 10.4–31.8)
TIBC: 405 ug/dL (ref 250–450)
UIBC: 353 ug/dL (ref 148–442)

## 2023-02-02 LAB — SEDIMENTATION RATE: Sed Rate: 41 mm/h — ABNORMAL HIGH (ref 0–22)

## 2023-02-03 LAB — KAPPA/LAMBDA LIGHT CHAINS
Kappa free light chain: 22.4 mg/L — ABNORMAL HIGH (ref 3.3–19.4)
Kappa, lambda light chain ratio: 1.85 — ABNORMAL HIGH (ref 0.26–1.65)
Lambda free light chains: 12.1 mg/L (ref 5.7–26.3)

## 2023-02-04 LAB — MULTIPLE MYELOMA PANEL, SERUM
Albumin SerPl Elph-Mcnc: 3.7 g/dL (ref 2.9–4.4)
Albumin/Glob SerPl: 1.2 (ref 0.7–1.7)
Alpha 1: 0.2 g/dL (ref 0.0–0.4)
Alpha2 Glob SerPl Elph-Mcnc: 0.8 g/dL (ref 0.4–1.0)
B-Globulin SerPl Elph-Mcnc: 1.1 g/dL (ref 0.7–1.3)
Gamma Glob SerPl Elph-Mcnc: 1 g/dL (ref 0.4–1.8)
Globulin, Total: 3.2 g/dL (ref 2.2–3.9)
IgA: 186 mg/dL (ref 87–352)
IgG (Immunoglobin G), Serum: 1114 mg/dL (ref 586–1602)
IgM (Immunoglobulin M), Srm: 102 mg/dL (ref 26–217)
Total Protein ELP: 6.9 g/dL (ref 6.0–8.5)

## 2023-02-10 ENCOUNTER — Other Ambulatory Visit: Payer: Self-pay

## 2023-02-10 ENCOUNTER — Encounter: Payer: Self-pay | Admitting: Physician Assistant

## 2023-02-10 ENCOUNTER — Inpatient Hospital Stay (HOSPITAL_BASED_OUTPATIENT_CLINIC_OR_DEPARTMENT_OTHER): Payer: BC Managed Care – PPO | Admitting: Physician Assistant

## 2023-02-10 VITALS — BP 150/116 | HR 91 | Temp 99.1°F | Resp 18 | Ht 73.0 in | Wt >= 6400 oz

## 2023-02-10 DIAGNOSIS — R600 Localized edema: Secondary | ICD-10-CM

## 2023-02-10 DIAGNOSIS — M545 Low back pain, unspecified: Secondary | ICD-10-CM | POA: Diagnosis not present

## 2023-02-10 DIAGNOSIS — D75839 Thrombocytosis, unspecified: Secondary | ICD-10-CM | POA: Diagnosis not present

## 2023-02-10 DIAGNOSIS — Z800 Family history of malignant neoplasm of digestive organs: Secondary | ICD-10-CM | POA: Diagnosis not present

## 2023-02-10 DIAGNOSIS — R0602 Shortness of breath: Secondary | ICD-10-CM

## 2023-02-10 DIAGNOSIS — D72829 Elevated white blood cell count, unspecified: Secondary | ICD-10-CM | POA: Diagnosis not present

## 2023-02-10 DIAGNOSIS — R609 Edema, unspecified: Secondary | ICD-10-CM | POA: Diagnosis not present

## 2023-02-10 DIAGNOSIS — G8929 Other chronic pain: Secondary | ICD-10-CM | POA: Diagnosis not present

## 2023-02-10 DIAGNOSIS — M546 Pain in thoracic spine: Secondary | ICD-10-CM | POA: Diagnosis not present

## 2023-02-10 DIAGNOSIS — R768 Other specified abnormal immunological findings in serum: Secondary | ICD-10-CM | POA: Diagnosis not present

## 2023-02-10 DIAGNOSIS — R700 Elevated erythrocyte sedimentation rate: Secondary | ICD-10-CM | POA: Diagnosis not present

## 2023-02-10 NOTE — Progress Notes (Signed)
[facility] [phone]   Fax:([phone]  PROGRESS NOTE  Patient Care Team: Rodrigo Ran, MD as PCP - General (Internal Medicine)  CHIEF COMPLAINTS/PURPOSE OF CONSULTATION:  Elevated serum kappa light chains  HISTORY OF PRESENTING ILLNESS:  Kaitlyn Raymond 42 y.o. female returns for a follow up for continued management of elevated serum kappa light chains. She was last seen by Dr. Clelia Croft on 01/23/2022. She presents today to transfer care to Dr. Leonides Schanz. She is unaccompanied for this visit.   On exam today, Mrs. Nuon reports having fatigue that has recently increased and can impact her ADLs. She reports new shortness of breath with minimal exertion over the past month. In addition, she reports swelling in her hands and feet that is new as well. She adds that she has gained 15-20 lbs over the last couple of months which was unintentional. She denies nausea, vomiting or bowel habit changes. She has chronic low back pain but reports new mid back pain. The back pain doesn't radiate and she denies any injury that caused the pain. She denies fevers, chills, sweats, chest pain or cough. She has no other complaints. Rest of the ROS is below.   MEDICAL HISTORY:  Past Medical History:  Diagnosis Date   Allergic rhinitis    Anemia    Arthritis    LEFT KNEE   Chondromalacia of left knee    Fluid retention    GERD (gastroesophageal reflux disease)    Headache    Heart murmur    Hypertension    Left ACL tear    Tear of MCL (medial collateral ligament) of knee    Wears glasses     SURGICAL HISTORY: Past Surgical History:  Procedure Laterality Date   CESAREAN SECTION  01-25-2010   w/ BILATERAL TUBAL LIGATION   KNEE ARTHROSCOPY WITH ANTERIOR CRUCIATE LIGAMENT (ACL) REPAIR WITH HAMSTRING GRAFT Left 02/22/2015   Procedure: LEFT KNEE ARTHROSCOPY; PARTIAL Lateral MENISCECTOMY; CHONDROPLASTY ALLOGRAFT WITH ANTERIOR CRUCIATE LIGAMENT (ACL) RECONSTRUCTION ;   Surgeon: Eugenia Mcalpine, MD;  Location: [facility];  Service: Orthopedics;  Laterality: Left;    SOCIAL HISTORY: Social History   Socioeconomic History   Marital status: Media planner    Spouse name: Not on file   Number of children: 2   Years of education: Assoc   Highest education level: Not on file  Occupational History   Occupation: G[employer]  Tobacco Use   Smoking status: Never   Smokeless tobacco: Never  Substance and Sexual Activity   Alcohol use: No    Alcohol/week: 0.0 standard drinks of alcohol   Drug use: No   Sexual activity: Not on file  Other Topics Concern   Not on file  Social History Narrative   Drinks 1 cup of coffee 2x a week    Social Determinants of Corporate investment banker Strain: Not on file  Food Insecurity: Not on file  Transportation Needs: Not on file  Physical Activity: Not on file  Stress: Not on file  Social Connections: Not on file  Intimate Partner Violence: Not on file    FAMILY HISTORY: Family History  Problem Relation Age of Onset   Hypertension Mother    Diabetes Father    Heart disease Father    Congestive Heart Failure Father    Hypertension Brother        oldest brother   Colon cancer Maternal Grandmother     ALLERGIES:  is allergic to penicillins and tomato.  MEDICATIONS:  Current Outpatient Medications  Medication Sig Dispense Refill   ALPRAZolam (XANAX) 0.25 MG tablet      ascorbic acid (VITAMIN C) 500 MG tablet Take by mouth.     cetirizine (ZYRTEC) 10 MG tablet 1 tablet     ergocalciferol (VITAMIN D2) 1.25 MG (50000 UT) capsule Take 50,000 Units by mouth once a week.     Ferrous Sulfate (IRON) 325 (65 Fe) MG TABS Take 1 tablet by mouth daily.     ibuprofen (ADVIL) 800 MG tablet Take by mouth.     losartan-hydrochlorothiazide (HYZAAR) 100-25 MG tablet Take 1 tablet by mouth daily.     Magnesium 250 MG TABS Take 250 mg by mouth. Every other night     pantoprazole (PROTONIX) 40 MG tablet 1 tablet      potassium chloride (KLOR-CON) 10 MEQ tablet Take 10 mEq by mouth every other day.     valACYclovir (VALTREX) 1000 MG tablet      Vitamin D, Ergocalciferol, (DRISDOL) 1.25 MG (50000 UNIT) CAPS capsule Take 50,000 Units by mouth once a week.     buPROPion (WELLBUTRIN XL) 150 MG 24 hr tablet Take 1 tablet by mouth every morning.     FLUoxetine (PROZAC) 20 MG capsule 1 capsule     ondansetron (ZOFRAN) 4 MG tablet Take 1 tablet (4 mg total) by mouth every 8 (eight) hours as needed for nausea or vomiting. 20 tablet 0   vitamin B-12 (CYANOCOBALAMIN) 100 MCG tablet 1 tablet     No current facility-administered medications for this visit.    REVIEW OF SYSTEMS:   Constitutional: ( - ) fevers, ( - )  chills , ( - ) night sweats Eyes: ( - ) blurriness of vision, ( - ) double vision, ( - ) watery eyes Ears, nose, mouth, throat, and face: ( - ) mucositis, ( - ) sore throat Respiratory: ( - ) cough, ( - ) dyspnea, ( - ) wheezes Cardiovascular: ( - ) palpitation, ( - ) chest discomfort, ( - ) lower extremity swelling Gastrointestinal:  ( - ) nausea, ( - ) heartburn, ( - ) change in bowel habits Skin: ( - ) abnormal skin rashes Lymphatics: ( - ) new lymphadenopathy, ( - ) easy bruising Neurological: ( - ) numbness, ( - ) tingling, ( - ) new weaknesses Behavioral/Psych: ( - ) mood change, ( - ) new changes  All other systems were reviewed with the patient and are negative.  PHYSICAL EXAMINATION: ECOG PERFORMANCE STATUS: 1 - Symptomatic but completely ambulatory  Vitals:   02/10/23 1315  BP: (!) 150/116  Pulse: 91  Resp: 18  Temp: 99.1 F (37.3 C)  SpO2: 100%   Filed Weights   02/10/23 1315  Weight: (!) 434 lb (196.9 kg)    GENERAL: well appearing female in NAD  SKIN: skin color, texture, turgor are normal, no rashes or significant lesions EYES: conjunctiva are pink and non-injected, sclera clear LUNGS: clear to auscultation and percussion with normal breathing effort HEART: regular rate  & rhythm and no murmurs. Edema involving hands and bilateral lower extremity edema Musculoskeletal: no cyanosis of digits and no clubbing  PSYCH: alert & oriented x 3, fluent speech NEURO: no focal motor/sensory deficits  LABORATORY DATA:  I have reviewed the data as listed    Latest Ref Rng & Units 02/02/2023   12:12 PM 01/23/2022   10:12 AM 07/24/2021   11:52 AM  CBC  WBC 4.0 - 10.5 K/uL 10.9  8.5  9.7   Hemoglobin 12.0 - 15.0 g/dL 57.8  46.9  62.9   Hematocrit 36.0 - 46.0 % 40.6  36.5  37.1   Platelets 150 - 400 K/uL 462  396  410        Latest Ref Rng & Units 02/02/2023   12:12 PM 01/23/2022   10:12 AM 07/24/2021   11:52 AM  CMP  Glucose 70 - 99 mg/dL 528  92  88   BUN 6 - 20 mg/dL 15  15  15    Creatinine 0.44 - 1.00 mg/dL 4.13  2.44  0.10   Sodium 135 - 145 mmol/L 137  138  138   Potassium 3.5 - 5.1 mmol/L 3.7  3.4  3.2   Chloride 98 - 111 mmol/L 103  100  102   CO2 22 - 32 mmol/L 25  30  28    Calcium 8.9 - 10.3 mg/dL 9.7  9.0  9.4   Total Protein 6.5 - 8.1 g/dL 7.6  7.4  7.2   Total Bilirubin 0.3 - 1.2 mg/dL 0.5  0.7  0.6   Alkaline Phos 38 - 126 U/L 89  86  83   AST 15 - 41 U/L 15  14  14    ALT 0 - 44 U/L 18  15  14     RADIOGRAPHIC STUDIES: I have personally reviewed the radiological images as listed and agreed with the findings in the report. No results found.  ASSESSMENT & PLAN ZOELLE MARKUS is a 42 y.o. female who presents to the clinic for evaluation of elevated kappa light chains. She will transfer care from Dr. Clelia Croft to Dr. Leonides Schanz today.   #Elevated kappa light chains: --Prior workup shows no evidence of serum monoclonal protein detected.  --Labs form 02/02/2023 showed stable elevated kappa light chain measuring 22.4 mg/L with a ratio of 1.85. SPEP again did not detect a monoclonal protein. Remaining results show mild leukocytosis (WBC 10.9) , thrombocytosis (462) and elevated sed rate  all suggestive of inflammatory process.  --Recommend to complete  workup with 24 hour UPEP and bone met survey. If there is no monoclonal protein in urine or evidence of lytic lesions, no further hematologic workup is required.  --Patient has previously established care with Dr. Maurine Minister Ang, rheumatologist at [facility], so we can request a follow up.   #Iron deficiency anemia: --Iron panel shows no deficiency --Continue with PO ferrous sulfate 325 mg daily with a source of vitamin C   #Shortness of breath #Edema in hands/feet --Due to acute onset of symptoms, we will obtain CTA chest and echocardiogram to evaluate for  cardiac and/or pulmonary etiologies   Orders Placed This Encounter  Procedures   DG Bone Survey Met    Standing Status:   Future    Standing Expiration Date:   02/10/2024    Order Specific Question:   Reason for Exam (SYMPTOM  OR DIAGNOSIS REQUIRED)    Answer:   evaluate for lytic lesions    Order Specific Question:   Is patient pregnant?    Answer:   No    Order Specific Question:   Preferred imaging location?    Answer:   [facility]   CT Angio Chest Pulmonary Embolism (PE) W or WO Contrast    Standing Status:   Future    Standing Expiration Date:   02/10/2024    Order Specific Question:   If indicated for the ordered procedure, I authorize the administration of contrast media per Radiology protocol  Answer:   Yes    Order Specific Question:   Does the patient have a contrast media/X-ray dye allergy?    Answer:   No    Order Specific Question:   Is patient pregnant?    Answer:   No    Order Specific Question:   Preferred imaging location?    Answer:   [facility]   24-Hr Ur UPEP/UIFE/Light Chains/TP    Standing Status:   Future    Standing Expiration Date:   02/10/2024   ECHOCARDIOGRAM COMPLETE    Standing Status:   Future    Standing Expiration Date:   02/10/2024    Order Specific Question:   Where should this test be performed    Answer:   [facility]    Order Specific Question:   Perflutren DEFINITY  (image enhancing agent) should be administered unless hypersensitivity or allergy exist    Answer:   Administer Perflutren    Order Specific Question:   Reason for exam-Echo    Answer:   Dyspnea  R06.00    All questions were answered. The patient knows to call the clinic with any problems, questions or concerns.  I have spent a total of 30 minutes minutes of face-to-face and non-face-to-face time, preparing to see the patient, obtaining and/or reviewing separately obtained history, performing a medically appropriate examination, counseling and educating the patient, ordering medications/tests/procedures, referring and communicating with other health care professionals, documenting clinical information in the electronic health record, independently interpreting results and communicating results to the patient, and care coordination.   Georga Kaufmann, PA-C Department of Hematology/Oncology [facility] Phone: [phone]  Patient was seen with Dr. Leonides Schanz

## 2023-02-11 ENCOUNTER — Ambulatory Visit (HOSPITAL_COMMUNITY)
Admission: RE | Admit: 2023-02-11 | Discharge: 2023-02-11 | Disposition: A | Discharge status: Home / Self Care | Payer: BC Managed Care – PPO | Source: Ambulatory Visit | Attending: Physician Assistant | Admitting: Physician Assistant

## 2023-02-11 DIAGNOSIS — R0602 Shortness of breath: Secondary | ICD-10-CM | POA: Diagnosis not present

## 2023-02-11 DIAGNOSIS — K8020 Calculus of gallbladder without cholecystitis without obstruction: Secondary | ICD-10-CM | POA: Diagnosis not present

## 2023-02-11 DIAGNOSIS — K760 Fatty (change of) liver, not elsewhere classified: Secondary | ICD-10-CM | POA: Diagnosis not present

## 2023-02-11 MED ORDER — IOHEXOL 350 MG/ML SOLN
100.0000 mL | Freq: Once | INTRAVENOUS | Status: AC | PRN
  Administered 2023-02-11: 100 mL via INTRAVENOUS

## 2023-02-12 ENCOUNTER — Other Ambulatory Visit (HOSPITAL_BASED_OUTPATIENT_CLINIC_OR_DEPARTMENT_OTHER): Payer: BC Managed Care – PPO

## 2023-02-12 DIAGNOSIS — R0602 Shortness of breath: Secondary | ICD-10-CM

## 2023-02-12 LAB — ECHOCARDIOGRAM COMPLETE
Area-P 1/2: 3.48 cm2
S' Lateral: 3.38 cm

## 2023-02-13 ENCOUNTER — Telehealth: Payer: Self-pay | Admitting: Physician Assistant

## 2023-02-13 NOTE — Telephone Encounter (Signed)
I called Ms. Kaitlyn Raymond to review the CTA chest and echocardiogram. No PE or acute cardiopulmonary disease identified. Advised patient to follow up with PCP regarding shortness of breath.   I am waiting for bone met survey and 24 hour UPEP to determine if patient has monoclonal protein. I will follow up next week.

## 2023-02-15 DIAGNOSIS — M545 Low back pain, unspecified: Secondary | ICD-10-CM | POA: Diagnosis not present

## 2023-02-15 DIAGNOSIS — D75839 Thrombocytosis, unspecified: Secondary | ICD-10-CM | POA: Diagnosis not present

## 2023-02-15 DIAGNOSIS — Z800 Family history of malignant neoplasm of digestive organs: Secondary | ICD-10-CM | POA: Diagnosis not present

## 2023-02-15 DIAGNOSIS — R609 Edema, unspecified: Secondary | ICD-10-CM | POA: Diagnosis not present

## 2023-02-15 DIAGNOSIS — D72829 Elevated white blood cell count, unspecified: Secondary | ICD-10-CM | POA: Diagnosis not present

## 2023-02-15 DIAGNOSIS — R0602 Shortness of breath: Secondary | ICD-10-CM | POA: Diagnosis not present

## 2023-02-15 DIAGNOSIS — M546 Pain in thoracic spine: Secondary | ICD-10-CM | POA: Diagnosis not present

## 2023-02-15 DIAGNOSIS — G8929 Other chronic pain: Secondary | ICD-10-CM | POA: Diagnosis not present

## 2023-02-15 DIAGNOSIS — R768 Other specified abnormal immunological findings in serum: Secondary | ICD-10-CM | POA: Diagnosis not present

## 2023-02-15 DIAGNOSIS — R700 Elevated erythrocyte sedimentation rate: Secondary | ICD-10-CM | POA: Diagnosis not present

## 2023-02-16 ENCOUNTER — Ambulatory Visit (HOSPITAL_COMMUNITY)
Admission: RE | Admit: 2023-02-16 | Discharge: 2023-02-16 | Disposition: A | Discharge status: Home / Self Care | Payer: BC Managed Care – PPO | Source: Ambulatory Visit | Attending: Physician Assistant | Admitting: Physician Assistant

## 2023-02-16 DIAGNOSIS — M4807 Spinal stenosis, lumbosacral region: Secondary | ICD-10-CM | POA: Diagnosis not present

## 2023-02-16 DIAGNOSIS — R768 Other specified abnormal immunological findings in serum: Secondary | ICD-10-CM | POA: Insufficient documentation

## 2023-02-16 DIAGNOSIS — M1712 Unilateral primary osteoarthritis, left knee: Secondary | ICD-10-CM | POA: Diagnosis not present

## 2023-02-18 LAB — UPEP/UIFE/LIGHT CHAINS/TP, 24-HR UR
% BETA, Urine: 0 %
ALPHA 1 URINE: 0 %
Albumin, U: 100 %
Alpha 2, Urine: 0 %
Free Kappa Lt Chains,Ur: 15.52 mg/L (ref 1.17–86.46)
Free Kappa/Lambda Ratio: 3.54 (ref 1.83–14.26)
Free Lambda Lt Chains,Ur: 4.39 mg/L (ref 0.27–15.21)
GAMMA GLOBULIN URINE: 0 %
Total Protein, Urine-Ur/day: 192 mg/(24.h) — ABNORMAL HIGH (ref 30–150)
Total Protein, Urine: 8 mg/dL
Total Volume: 2400

## 2023-02-25 ENCOUNTER — Encounter: Payer: Self-pay | Admitting: Physician Assistant

## 2023-02-26 DIAGNOSIS — Z1331 Encounter for screening for depression: Secondary | ICD-10-CM | POA: Diagnosis not present

## 2023-02-26 DIAGNOSIS — Z0000 Encounter for general adult medical examination without abnormal findings: Secondary | ICD-10-CM | POA: Diagnosis not present

## 2023-02-26 DIAGNOSIS — E611 Iron deficiency: Secondary | ICD-10-CM | POA: Diagnosis not present

## 2023-02-26 DIAGNOSIS — I10 Essential (primary) hypertension: Secondary | ICD-10-CM | POA: Diagnosis not present

## 2023-02-26 DIAGNOSIS — R82998 Other abnormal findings in urine: Secondary | ICD-10-CM | POA: Diagnosis not present

## 2023-02-26 DIAGNOSIS — E559 Vitamin D deficiency, unspecified: Secondary | ICD-10-CM | POA: Diagnosis not present

## 2023-02-26 DIAGNOSIS — Z1339 Encounter for screening examination for other mental health and behavioral disorders: Secondary | ICD-10-CM | POA: Diagnosis not present

## 2023-03-04 ENCOUNTER — Telehealth: Payer: Self-pay

## 2023-03-04 NOTE — Telephone Encounter (Signed)
LM for pt with results and recommendations  Pt was also advised via My Chart message

## 2023-03-04 NOTE — Telephone Encounter (Signed)
-----   Message from Pollyann Samples sent at 03/04/2023  2:50 PM EST ----- Kaitlyn Raymond, please let pt know UPEP is unremarkable and bone met survey is negative, no evidence of monoclonal protein. Based on Irene and Dr. Derek Mound note, no further work up is indicated and she can f/up with PCP.  Thanks Lacie NP

## 2023-04-27 DIAGNOSIS — J0110 Acute frontal sinusitis, unspecified: Secondary | ICD-10-CM | POA: Diagnosis not present

## 2023-04-27 DIAGNOSIS — H6693 Otitis media, unspecified, bilateral: Secondary | ICD-10-CM | POA: Diagnosis not present

## 2023-04-27 DIAGNOSIS — J189 Pneumonia, unspecified organism: Secondary | ICD-10-CM | POA: Diagnosis not present

## 2023-05-04 DIAGNOSIS — R0602 Shortness of breath: Secondary | ICD-10-CM | POA: Diagnosis not present

## 2023-05-04 DIAGNOSIS — J157 Pneumonia due to Mycoplasma pneumoniae: Secondary | ICD-10-CM | POA: Diagnosis not present

## 2023-05-04 DIAGNOSIS — H6690 Otitis media, unspecified, unspecified ear: Secondary | ICD-10-CM | POA: Diagnosis not present

## 2023-09-03 DIAGNOSIS — F411 Generalized anxiety disorder: Secondary | ICD-10-CM | POA: Diagnosis not present

## 2023-09-03 DIAGNOSIS — F4312 Post-traumatic stress disorder, chronic: Secondary | ICD-10-CM | POA: Diagnosis not present

## 2023-09-09 DIAGNOSIS — I10 Essential (primary) hypertension: Secondary | ICD-10-CM | POA: Diagnosis not present

## 2023-09-09 DIAGNOSIS — N390 Urinary tract infection, site not specified: Secondary | ICD-10-CM | POA: Diagnosis not present

## 2023-09-09 DIAGNOSIS — R7301 Impaired fasting glucose: Secondary | ICD-10-CM | POA: Diagnosis not present

## 2023-09-09 DIAGNOSIS — E559 Vitamin D deficiency, unspecified: Secondary | ICD-10-CM | POA: Diagnosis not present

## 2023-09-09 DIAGNOSIS — R609 Edema, unspecified: Secondary | ICD-10-CM | POA: Diagnosis not present

## 2023-09-10 DIAGNOSIS — F411 Generalized anxiety disorder: Secondary | ICD-10-CM | POA: Diagnosis not present

## 2023-09-10 DIAGNOSIS — F4312 Post-traumatic stress disorder, chronic: Secondary | ICD-10-CM | POA: Diagnosis not present

## 2023-09-29 DIAGNOSIS — F4312 Post-traumatic stress disorder, chronic: Secondary | ICD-10-CM | POA: Diagnosis not present

## 2023-09-29 DIAGNOSIS — F411 Generalized anxiety disorder: Secondary | ICD-10-CM | POA: Diagnosis not present

## 2023-10-01 DIAGNOSIS — J988 Other specified respiratory disorders: Secondary | ICD-10-CM | POA: Diagnosis not present

## 2023-10-01 DIAGNOSIS — G4733 Obstructive sleep apnea (adult) (pediatric): Secondary | ICD-10-CM | POA: Diagnosis not present

## 2023-10-01 DIAGNOSIS — I10 Essential (primary) hypertension: Secondary | ICD-10-CM | POA: Diagnosis not present

## 2023-10-27 DIAGNOSIS — F4312 Post-traumatic stress disorder, chronic: Secondary | ICD-10-CM | POA: Diagnosis not present

## 2023-10-27 DIAGNOSIS — F411 Generalized anxiety disorder: Secondary | ICD-10-CM | POA: Diagnosis not present

## 2023-10-29 DIAGNOSIS — G4733 Obstructive sleep apnea (adult) (pediatric): Secondary | ICD-10-CM | POA: Diagnosis not present

## 2023-11-03 DIAGNOSIS — G4733 Obstructive sleep apnea (adult) (pediatric): Secondary | ICD-10-CM | POA: Diagnosis not present

## 2023-11-16 DIAGNOSIS — F4312 Post-traumatic stress disorder, chronic: Secondary | ICD-10-CM | POA: Diagnosis not present

## 2023-11-16 DIAGNOSIS — F411 Generalized anxiety disorder: Secondary | ICD-10-CM | POA: Diagnosis not present

## 2023-12-08 DIAGNOSIS — F411 Generalized anxiety disorder: Secondary | ICD-10-CM | POA: Diagnosis not present

## 2023-12-08 DIAGNOSIS — F4312 Post-traumatic stress disorder, chronic: Secondary | ICD-10-CM | POA: Diagnosis not present

## 2024-04-25 DIAGNOSIS — Z1212 Encounter for screening for malignant neoplasm of rectum: Secondary | ICD-10-CM | POA: Diagnosis not present

## 2024-04-25 DIAGNOSIS — I10 Essential (primary) hypertension: Secondary | ICD-10-CM | POA: Diagnosis not present

## 2024-04-25 DIAGNOSIS — E559 Vitamin D deficiency, unspecified: Secondary | ICD-10-CM | POA: Diagnosis not present

## 2024-04-25 DIAGNOSIS — D649 Anemia, unspecified: Secondary | ICD-10-CM | POA: Diagnosis not present
